# Patient Record
Sex: Female | Born: 1945
Health system: Southern US, Community
[De-identification: ages and names within clinical notes are randomized; demographics above are authoritative.]

## PROBLEM LIST (undated history)

## (undated) DIAGNOSIS — E039 Hypothyroidism, unspecified: Secondary | ICD-10-CM

## (undated) DIAGNOSIS — Z85828 Personal history of other malignant neoplasm of skin: Secondary | ICD-10-CM

## (undated) DIAGNOSIS — H269 Unspecified cataract: Secondary | ICD-10-CM

## (undated) DIAGNOSIS — Z8719 Personal history of other diseases of the digestive system: Secondary | ICD-10-CM

## (undated) DIAGNOSIS — F17200 Nicotine dependence, unspecified, uncomplicated: Secondary | ICD-10-CM

## (undated) DIAGNOSIS — G43909 Migraine, unspecified, not intractable, without status migrainosus: Secondary | ICD-10-CM

## (undated) HISTORY — DX: Personal history of other diseases of the digestive system: Z87.19

## (undated) HISTORY — DX: Migraine, unspecified, not intractable, without status migrainosus: G43.909

## (undated) HISTORY — DX: Personal history of other malignant neoplasm of skin: Z85.828

## (undated) HISTORY — DX: Hypothyroidism, unspecified: E03.9

## (undated) HISTORY — DX: Unspecified cataract: H26.9

## (undated) HISTORY — DX: Nicotine dependence, unspecified, uncomplicated: F17.200

---

## 1999-01-02 ENCOUNTER — Other Ambulatory Visit: Admission: RE | Admit: 1999-01-02 | Discharge: 1999-01-02 | Payer: Self-pay | Admitting: Gynecology

## 1999-01-06 ENCOUNTER — Other Ambulatory Visit: Admission: RE | Admit: 1999-01-06 | Discharge: 1999-01-06 | Payer: Self-pay | Admitting: Gastroenterology

## 1999-02-19 ENCOUNTER — Other Ambulatory Visit: Admission: RE | Admit: 1999-02-19 | Discharge: 1999-02-19 | Payer: Self-pay | Admitting: *Deleted

## 2000-01-29 ENCOUNTER — Other Ambulatory Visit: Admission: RE | Admit: 2000-01-29 | Discharge: 2000-01-29 | Payer: Self-pay | Admitting: Gynecology

## 2000-07-04 ENCOUNTER — Encounter: Payer: Self-pay | Admitting: Emergency Medicine

## 2000-07-04 ENCOUNTER — Emergency Department (HOSPITAL_COMMUNITY): Admission: EM | Admit: 2000-07-04 | Discharge: 2000-07-04 | Payer: Self-pay | Admitting: Emergency Medicine

## 2001-02-07 ENCOUNTER — Other Ambulatory Visit: Admission: RE | Admit: 2001-02-07 | Discharge: 2001-02-07 | Payer: Self-pay | Admitting: Gynecology

## 2002-03-02 ENCOUNTER — Other Ambulatory Visit: Admission: RE | Admit: 2002-03-02 | Discharge: 2002-03-02 | Payer: Self-pay | Admitting: Gynecology

## 2002-11-02 HISTORY — PX: THYROIDECTOMY: SHX17

## 2003-04-09 ENCOUNTER — Other Ambulatory Visit: Admission: RE | Admit: 2003-04-09 | Discharge: 2003-04-09 | Payer: Self-pay | Admitting: Gynecology

## 2004-10-30 ENCOUNTER — Encounter: Admission: RE | Admit: 2004-10-30 | Discharge: 2004-10-30 | Payer: Self-pay | Admitting: General Surgery

## 2004-11-02 LAB — CONVERTED CEMR LAB: Pap Smear: NORMAL

## 2004-11-20 ENCOUNTER — Other Ambulatory Visit: Admission: RE | Admit: 2004-11-20 | Discharge: 2004-11-20 | Payer: Self-pay | Admitting: Gynecology

## 2004-11-28 ENCOUNTER — Ambulatory Visit (HOSPITAL_COMMUNITY): Admission: RE | Admit: 2004-11-28 | Discharge: 2004-11-28 | Payer: Self-pay | Admitting: General Surgery

## 2004-11-28 ENCOUNTER — Encounter (INDEPENDENT_AMBULATORY_CARE_PROVIDER_SITE_OTHER): Payer: Self-pay | Admitting: *Deleted

## 2006-11-12 ENCOUNTER — Encounter (INDEPENDENT_AMBULATORY_CARE_PROVIDER_SITE_OTHER): Payer: Self-pay | Admitting: Specialist

## 2006-11-12 ENCOUNTER — Ambulatory Visit (HOSPITAL_COMMUNITY): Admission: RE | Admit: 2006-11-12 | Discharge: 2006-11-13 | Payer: Self-pay | Admitting: Surgery

## 2010-10-16 ENCOUNTER — Ambulatory Visit: Payer: Self-pay | Admitting: Family Medicine

## 2010-11-13 ENCOUNTER — Ambulatory Visit
Admission: RE | Admit: 2010-11-13 | Discharge: 2010-11-13 | Payer: Self-pay | Source: Home / Self Care | Attending: Family Medicine | Admitting: Family Medicine

## 2010-11-13 ENCOUNTER — Other Ambulatory Visit: Payer: Self-pay | Admitting: Family Medicine

## 2010-11-13 DIAGNOSIS — F17210 Nicotine dependence, cigarettes, uncomplicated: Secondary | ICD-10-CM | POA: Insufficient documentation

## 2010-11-13 DIAGNOSIS — G43909 Migraine, unspecified, not intractable, without status migrainosus: Secondary | ICD-10-CM

## 2010-11-13 DIAGNOSIS — F17200 Nicotine dependence, unspecified, uncomplicated: Secondary | ICD-10-CM

## 2010-11-13 DIAGNOSIS — Z85828 Personal history of other malignant neoplasm of skin: Secondary | ICD-10-CM | POA: Insufficient documentation

## 2010-11-13 DIAGNOSIS — Z8719 Personal history of other diseases of the digestive system: Secondary | ICD-10-CM

## 2010-11-13 DIAGNOSIS — E039 Hypothyroidism, unspecified: Secondary | ICD-10-CM

## 2010-11-13 HISTORY — DX: Nicotine dependence, unspecified, uncomplicated: F17.200

## 2010-11-13 HISTORY — DX: Personal history of other malignant neoplasm of skin: Z85.828

## 2010-11-13 HISTORY — DX: Personal history of other diseases of the digestive system: Z87.19

## 2010-11-13 HISTORY — DX: Migraine, unspecified, not intractable, without status migrainosus: G43.909

## 2010-11-13 HISTORY — DX: Hypothyroidism, unspecified: E03.9

## 2010-11-14 LAB — TSH: TSH: 0.95 u[IU]/mL (ref 0.35–5.50)

## 2010-11-20 ENCOUNTER — Other Ambulatory Visit: Payer: Self-pay | Admitting: Dermatology

## 2010-12-04 NOTE — Assessment & Plan Note (Signed)
Summary: BRAND NEW PT/TO EST/CJR/PATIENT RESCHED.   Vital Signs:  Patient profile:   65 year old female Menstrual status:  postmenopausal Height:      65.75 inches Weight:      157 pounds BMI:     25.63 Temp:     98.2 degrees F oral Pulse rate:   72 / minute Pulse rhythm:   regular Resp:     12 per minute BP sitting:   120 / 78  (left arm) Cuff size:   regular  Vitals Entered By: Sid Falcon LPN (November 13, 2010 10:15 AM)  Nutrition Counseling: Patient's BMI is greater than 25 and therefore counseled on weight management options.     Menstrual Status postmenopausal Last PAP Result normal   History of Present Illness: New patient to establish care. Patient has history of hypothyroidism following thyroidectomy 2004 for benign tumor. History of squamous cell cancer  lower legs followed closely by dermatology. Remote history of migraines. Reported history of hepatitis, questionably hepatitis A many years ago. History diverticulitis. Intolerance to codeine.  Family history mother kidney cancer metastatic to lung. Father died of coronary artery disease age 49. Follow up hypertension history.  Patient is married. One child. Retired Designer, jewellery. Smokes and no interest in quitting. No alcohol use. Enjoys golf.  Preventive Screening-Counseling & Management  Alcohol-Tobacco     Smoking Status: current     Packs/Day: 0.75     Year Started: 1980  Caffeine-Diet-Exercise     Does Patient Exercise: yes  Allergies (verified): 1)  Codeine Sulfate (Codeine Sulfate)  Past History:  Family History: Last updated: 11/13/2010 Breast CV, both grandmothers, 2 aunts Mother, kidney CA,  Father, heart disease age 22, hypertension  Social History: Last updated: 11/13/2010 Retired  Engineer, civil (consulting), RN Married Current Smoker Alcohol use-no Regular exercise-yes 1 pregnancy 1 live birth  Risk Factors: Exercise: yes (11/13/2010)  Risk Factors: Smoking Status: current  (11/13/2010) Packs/Day: 0.75 (11/13/2010)  Past Medical History: Diverticulitis, hx of Hepatitis/Jaundice ?hep A Migraines Hypothyroid Squamous cell ca legs  Past Surgical History: Thyroidectomy  2004 PMH-FH-SH reviewed for relevance  Family History: Breast CV, both grandmothers, 2 aunts Mother, kidney CA,  Father, heart disease age 21, hypertension  Social History: Retired  Engineer, civil (consulting), Charity fundraiser Married Current Smoker Alcohol use-no Regular exercise-yes 1 pregnancy 1 live birth Smoking Status:  current Packs/Day:  0.75 Does Patient Exercise:  yes  Review of Systems  The patient denies anorexia, fever, weight loss, weight gain, vision loss, decreased hearing, hoarseness, chest pain, syncope, dyspnea on exertion, peripheral edema, prolonged cough, headaches, hemoptysis, abdominal pain, melena, hematochezia, severe indigestion/heartburn, hematuria, incontinence, genital sores, muscle weakness, suspicious skin lesions, transient blindness, difficulty walking, depression, unusual weight change, abnormal bleeding, enlarged lymph nodes, and breast masses.    Physical Exam  General:  Well-developed,well-nourished,in no acute distress; alert,appropriate and cooperative throughout examination Eyes:  pupils equal, pupils round, and pupils reactive to light.   Ears:  External ear exam shows no significant lesions or deformities.  Otoscopic examination reveals clear canals, tympanic membranes are intact bilaterally without bulging, retraction, inflammation or discharge. Hearing is grossly normal bilaterally. Mouth:  Oral mucosa and oropharynx without lesions or exudates.  Teeth in good repair. Neck:  scar prior surgery, no mass Lungs:  Normal respiratory effort, chest expands symmetrically. Lungs are clear to auscultation, no crackles or wheezes. Heart:  Normal rate and regular rhythm. S1 and S2 normal without gallop, murmur, click, rub or other extra sounds. Extremities:  no edema Cervical  Nodes:  No lymphadenopathy noted Psych:  normally interactive, good eye contact, not anxious appearing, and not depressed appearing.     Impression & Recommendations:  Problem # 1:  HYPOTHYROIDISM (ICD-244.9) repeat TSH Her updated medication list for this problem includes:    Synthroid 112 Mcg Tabs (Levothyroxine sodium) ..... Once daily  Orders: TLB-TSH (Thyroid Stimulating Hormone) (84443-TSH) Venipuncture (81191) Specimen Handling (47829)  Problem # 2:  DIVERTICULITIS, HX OF (ICD-V12.79)  Problem # 3:  MIGRAINE HEADACHE (ICD-346.90)  Problem # 4:  SKIN CANCER, HX OF (ICD-V10.83)  Problem # 5:  TOBACCO ABUSE (ICD-305.1) pt not currently motivated to quit.  Complete Medication List: 1)  Synthroid 112 Mcg Tabs (Levothyroxine sodium) .... Once daily  Other Orders: Pneumococcal Vaccine (56213) Admin 1st Vaccine (08657)  Patient Instructions: 1)  consider physical examination at some point this year   Orders Added: 1)  TLB-TSH (Thyroid Stimulating Hormone) [84443-TSH] 2)  Venipuncture [84696] 3)  Specimen Handling [99000] 4)  Pneumococcal Vaccine [90732] 5)  Admin 1st Vaccine [90471] 6)  New Patient Level III [29528]   Immunizations Administered:  Pneumonia Vaccine:    Vaccine Type: Pneumovax    Site: right deltoid    Mfr: Merck    Dose: 0.5 ml    Route: IM    Given by: Sid Falcon LPN    Exp. Date: 03/02/2012    Lot #: [id number]   Immunizations Administered:  Pneumonia Vaccine:    Vaccine Type: Pneumovax    Site: right deltoid    Mfr: Merck    Dose: 0.5 ml    Route: IM    Given by: Sid Falcon LPN    Exp. Date: 03/02/2012    Lot #: [id number]  Preventive Care Screening  Mammogram:    Date:  11/02/2009    Results:  normal   Colonoscopy:    Date:  11/02/2008    Results:  normal   Pap Smear:    Date:  11/02/2004    Results:  normal

## 2011-01-26 ENCOUNTER — Other Ambulatory Visit: Payer: Self-pay | Admitting: Dermatology

## 2011-03-05 ENCOUNTER — Encounter: Payer: Self-pay | Admitting: Family Medicine

## 2011-03-09 ENCOUNTER — Encounter: Payer: Self-pay | Admitting: Family Medicine

## 2011-03-20 NOTE — Op Note (Signed)
[name], [name]                 ACCOUNT NO.:  [id number]   MEDICAL RECORD NO.:  [id number]          PATIENT TYPE:  AMB   LOCATION:  DAY                          FACILITY:  [facility]   PHYSICIAN:  Velora Heckler, MD      DATE OF BIRTH:  07/31/1946   DATE OF PROCEDURE:  11/12/2006  DATE OF DISCHARGE:                               OPERATIVE REPORT   PREOP DIAGNOSIS:  Thyroid goiter with compressive symptoms.   POSTOPERATIVE DIAGNOSIS:  Thyroid goiter with compressive symptoms.   PROCEDURE:  Total thyroidectomy.   SURGEON:  Velora Heckler, MD, FACS   ASSISTANT:  Leonie Man, MD   ANESTHESIA:  General.   ESTIMATED BLOOD LOSS:  Minimal.   PREPARATION:  Betadine.   COMPLICATIONS:  None.   INDICATIONS:  The patient is a 65 year old white female from [place], [place].  She has been followed by Dr. Jerelene Redden.  She has a known thyroid goiter.  On sequential ultrasound examination,  her goiter has increased in size.  Fine-needle aspiration has been  performed.  The patient has developed mild compressive symptoms.  She  now comes to surgery for resection.   BODY OF REPORT:  Procedure is done in OR #6 at the [facility].  The patient is brought to the operating room, and placed in  the supine position on the operating room table.  Following  administration of general anesthesia, the patient is prepped and draped  in the usual strict aseptic fashion.  After ascertaining that an  adequate level of anesthesia had been obtained, a Kocher incision is  made with a #15 blade.  Dissection is carried through subcutaneous  tissues and platysma.  Hemostasis is obtained with the electrocautery.  Skin flaps are elevated cephalad and caudad from the thyroid notch to  the sternal notch.  A Mahorner self-retaining retractor is placed for  exposure.   Strap muscles are incised in the midline and dissection is begun on the  left side.  Left thyroid lobe is  exposed by reflecting the strap muscles  laterally.  Venous tributaries are divided with the harmonic scalpel.  Gland is gently dissected out using a Barista.  Inferior venous  tributaries are divided between medium Ligaclips and use of harmonic  scalpel.  Superior pole vessels are dissected out.  They are divided  with the harmonic scalpel.  Gland is rolled anteriorly.  Superior  parathyroid gland is identified and preserved.  Recurrent laryngeal  nerve is identified and preserved.  Branches of the inferior thyroid  artery are divided with the harmonic scalpel.  Gland is rolled further  anteriorly.  Using the electrocautery, the ligament of Allyson Sabal is  transected; and dissection is carried down and onto the trachea.  Isthmus is mobilized across the midline.  Good hemostasis is noted.  Dry  pack is placed in the left neck.   Next, we turned our attention to the right thyroid lobe.  The right  thyroid lobe is markedly larger than the left.  Both appear to be  goitrous.  There  are no dominant masses.  Strap muscles are again  reflected laterally.  Middle thyroid vein is divided between medium  Ligaclips.  Inferior venous tributaries are divided with the harmonic  scalpel.  The superior pole vessels are dissected out.  The superior  thyroid artery is occluded with a large Ligaclip and then divided with  the harmonic scalpel.  The gland is rolled anteriorly.  Parathyroid  tissue is identified and preserved.  The branches of the inferior  thyroid artery are divided between small Ligaclips.  Inferior venous  tributaries are divided with the harmonic scalpel.  Gland is rolled  further anteriorly; and ligament of Berry transected with the  electrocautery.  Care is taken to preserve the recurrent nerve.  There  is a small pyramidal lobe which is included with the specimen.  The  gland is excised off the trachea; and the inferior central compartment  tissue divided with the harmonic  scalpel.  The specimen is marked with a  suture in the right superior pole.  The entire gland is then submitted  to pathology for review.  Good hemostasis is noted.  Neck is irrigated  with warm saline which is evacuated.  Surgicel is placed over the area  of the recurrent nerve and parathyroid glands bilaterally.  Strap  muscles were reapproximated in the midline with interrupted 3-0 Vicryl  sutures.  Platysma is closed with interrupted 3-0 Vicryl sutures.  Skin  is closed with a running 4-0 Monocryl subcuticular suture.  Wound is  washed and dried; and Benzoin and Steri-Strips are applied.  Sterile  dressings are applied.  The patient is awakened from anesthesia; and  brought to the recovery room in stable condition.  The patient tolerated  the procedure well.      Velora Heckler, MD  Electronically Signed     TMG/MEDQ  D:  11/12/2006  T:  11/12/2006  Job:  [id number]   cc:   Gita Kudo, M.D.  [address], [address]  [place]  [place] [address]

## 2011-03-31 ENCOUNTER — Encounter: Payer: Self-pay | Admitting: Family Medicine

## 2011-04-01 ENCOUNTER — Encounter: Payer: Self-pay | Admitting: Family Medicine

## 2011-04-01 ENCOUNTER — Ambulatory Visit (INDEPENDENT_AMBULATORY_CARE_PROVIDER_SITE_OTHER): Payer: Medicare Other | Admitting: Family Medicine

## 2011-04-01 VITALS — BP 122/78 | HR 72 | Temp 98.0°F | Resp 12 | Ht 65.75 in | Wt 151.0 lb

## 2011-04-01 DIAGNOSIS — Z23 Encounter for immunization: Secondary | ICD-10-CM

## 2011-04-01 DIAGNOSIS — E039 Hypothyroidism, unspecified: Secondary | ICD-10-CM

## 2011-04-01 DIAGNOSIS — Z0000 Encounter for general adult medical examination without abnormal findings: Secondary | ICD-10-CM

## 2011-04-01 LAB — CBC WITH DIFFERENTIAL/PLATELET
Basophils Relative: 0.7 % (ref 0.0–3.0)
Eosinophils Absolute: 0.2 10*3/uL (ref 0.0–0.7)
Hemoglobin: 14.4 g/dL (ref 12.0–15.0)
Lymphocytes Relative: 32.2 % (ref 12.0–46.0)
MCHC: 34.2 g/dL (ref 30.0–36.0)
MCV: 92 fl (ref 78.0–100.0)
Neutro Abs: 3.7 10*3/uL (ref 1.4–7.7)
RBC: 4.6 Mil/uL (ref 3.87–5.11)

## 2011-04-01 LAB — HEPATIC FUNCTION PANEL
ALT: 21 U/L (ref 0–35)
Albumin: 4 g/dL (ref 3.5–5.2)
Alkaline Phosphatase: 70 U/L (ref 39–117)
Total Protein: 6.8 g/dL (ref 6.0–8.3)

## 2011-04-01 LAB — BASIC METABOLIC PANEL
CO2: 28 mEq/L (ref 19–32)
Calcium: 9.5 mg/dL (ref 8.4–10.5)
Chloride: 107 mEq/L (ref 96–112)
Sodium: 142 mEq/L (ref 135–145)

## 2011-04-01 LAB — LIPID PANEL: Total CHOL/HDL Ratio: 3

## 2011-04-01 NOTE — Patient Instructions (Signed)
Continue to work on smoking cessation. Continue regular use of sunblock Continue calcium and vitamin D supplementation Continue regular weightbearing exercise. Continue yearly mammogram. Consider baseline bone density scan by next year

## 2011-04-01 NOTE — Progress Notes (Signed)
Subjective:    Patient ID: [name], female    DOB: Jul 09, 1946, 65 y.o.   MRN: [id number]  HPI Patient is here for Medicare initial wellness exam. Past medical history significant for hypothyroidism, migraine headaches, squamous cell skin cancer, and ongoing nicotine use. She sees dermatologist regularly.  1.  Risk factors based on Past Medical , Social, and Family history  history of hypothyroidism and migraine headaches. Ongoing nicotine use. No alcohol use. Family history breast cancer and a couple aunts 2.  Limitations in physical activities patient plays golf frequently. For a couple he walks. No limitation in activities 3.  Depression/mood no depression or anxiety issues 4.  Hearing no hearing deficits 5.  ADLs fully independent in all 6.  Cognitive function (orientation to time and place, language, writing, speech,memory) patient has no deficits and short or long-term memory. No language deficits.  No judgment deficits 7.  Home Safety no issues identified 8.  Height, weight, and visual acuity. Height and weight stable. No visual changes. 9.  Counseling counseled regarding smoking cessation. Counseled regarding immunizations including tetanus booster and consideration for shingles vaccine. 10. Recommendation of preventive services. Tetanus booster. Check on coverage for shingles vaccine. Colonoscopy and mammogram up-to-date. Patient refuses further Pap smears. Discussed consideration for DEXA scan. Baseline EKG. Discussed vitamin D and calcium supplementation 11. Labs based on risk factors lipid panel and basic metabolic panel 12. Care Plan  As above.  Rec smoking cessation.  Continue with preventive screenings as above.  She refuses pap and DEXA at this time.    Review of Systems  Constitutional: Negative for fever, chills, activity change, appetite change and fatigue.  HENT: Negative for hearing loss, ear pain, sore throat, trouble swallowing and sinus pressure.   Eyes: Negative  for visual disturbance.  Respiratory: Negative for cough and shortness of breath.   Cardiovascular: Negative for chest pain and palpitations.  Gastrointestinal: Negative for abdominal pain, diarrhea, constipation and blood in stool.  Genitourinary: Negative for dysuria and hematuria.  Musculoskeletal: Negative for myalgias, back pain and arthralgias.  Skin: Negative for rash.  Neurological: Negative for dizziness, seizures, syncope and headaches.  Hematological: Negative for adenopathy.  Psychiatric/Behavioral: Negative for confusion and dysphoric mood.       Objective:   Physical Exam  Constitutional: She is oriented to person, place, and time. She appears well-developed and well-nourished.  HENT:  Head: Normocephalic and atraumatic.  Eyes: EOM are normal. Pupils are equal, round, and reactive to light.  Neck: Normal range of motion. Neck supple. No thyromegaly present.  Cardiovascular: Normal rate, regular rhythm and normal heart sounds.   No murmur heard. Pulmonary/Chest: Breath sounds normal. No respiratory distress. She has no wheezes. She has no rales.  Abdominal: Soft. Bowel sounds are normal. She exhibits no distension and no mass. There is no tenderness. There is no rebound and no guarding.  Musculoskeletal: Normal range of motion. She exhibits no edema.  Lymphadenopathy:    She has no cervical adenopathy.  Neurological: She is alert and oriented to person, place, and time. She displays normal reflexes. No cranial nerve deficit.  Skin: No rash noted.  Psychiatric: She has a normal mood and affect. Her behavior is normal. Judgment and thought content normal.          Assessment & Plan:  Patient here for Medicare wellness exam. Tetanus booster given. Zostavax discussed and recommended if covered by insurance. Obtain lipid panel and basic metabolic panel. Baseline EKG. Discussed smoking cessation. Patient declines further  Pap smears. Colonoscopy update. Recommended  consideration DEXA scan and she wishes to wait EKG sinus bradycardia otherwise normal. Smoking cessation discussed and pt does not feel ready at this time.

## 2011-04-02 NOTE — Progress Notes (Signed)
Quick Note:  Pt informed ______ 

## 2011-04-21 ENCOUNTER — Other Ambulatory Visit: Payer: Self-pay | Admitting: Dermatology

## 2011-05-27 ENCOUNTER — Other Ambulatory Visit: Payer: Self-pay | Admitting: Dermatology

## 2011-09-08 ENCOUNTER — Other Ambulatory Visit: Payer: Self-pay | Admitting: Dermatology

## 2011-09-09 ENCOUNTER — Other Ambulatory Visit: Payer: Self-pay | Admitting: Dermatology

## 2011-11-09 ENCOUNTER — Ambulatory Visit (INDEPENDENT_AMBULATORY_CARE_PROVIDER_SITE_OTHER): Payer: Medicare Other | Admitting: Family Medicine

## 2011-11-09 ENCOUNTER — Encounter: Payer: Self-pay | Admitting: Family Medicine

## 2011-11-09 VITALS — BP 140/82 | Temp 98.6°F | Wt 156.0 lb

## 2011-11-09 DIAGNOSIS — R05 Cough: Secondary | ICD-10-CM

## 2011-11-09 DIAGNOSIS — E039 Hypothyroidism, unspecified: Secondary | ICD-10-CM

## 2011-11-09 DIAGNOSIS — R1032 Left lower quadrant pain: Secondary | ICD-10-CM

## 2011-11-09 MED ORDER — LEVOTHYROXINE SODIUM 112 MCG PO TABS: 112.0000 ug | ORAL_TABLET | Freq: Every day | ORAL | Status: DC

## 2011-11-09 MED ORDER — CIPROFLOXACIN HCL 500 MG PO TABS: 500.0000 mg | ORAL_TABLET | Freq: Two times a day (BID) | ORAL | Status: AC

## 2011-11-09 MED ORDER — METRONIDAZOLE 500 MG PO TABS: 500.0000 mg | ORAL_TABLET | Freq: Two times a day (BID) | ORAL | Status: AC

## 2011-11-09 NOTE — Patient Instructions (Signed)
Diverticulitis A diverticulum is a small pouch or sac on the colon. Diverticulosis is the presence of these diverticula on the colon. Diverticulitis is the irritation (inflammation) or infection of diverticula. CAUSES  The colon and its diverticula contain bacteria. If food particles block the tiny opening to a diverticulum, the bacteria inside can grow and cause an increase in pressure. This leads to infection and inflammation and is called diverticulitis. SYMPTOMS   Abdominal pain and tenderness. Usually, the pain is located on the left side of your abdomen. However, it could be located elsewhere.   Fever.   Bloating.   Feeling sick to your stomach (nausea).   Throwing up (vomiting).   Abnormal stools.  DIAGNOSIS  Your caregiver will take a history and perform a physical exam. Since many things can cause abdominal pain, other tests may be necessary. Tests may include:  Blood tests.   Urine tests.   X-ray of the abdomen.   CT scan of the abdomen.  Sometimes, surgery is needed to determine if diverticulitis or other conditions are causing your symptoms. TREATMENT  Most of the time, you can be treated without surgery. Treatment includes:  Resting the bowels by only having liquids for a few days. As you improve, you will need to eat a low-fiber diet.   Intravenous (IV) fluids if you are losing body fluids (dehydrated).   Antibiotic medicines that treat infections may be given.   Pain and nausea medicine, if needed.   Surgery if the inflamed diverticulum has burst.  HOME CARE INSTRUCTIONS   Try a clear liquid diet (broth, tea, or water for as long as directed by your caregiver). You may then gradually begin a low-fiber diet as tolerated. A low-fiber diet is a diet with less than 10 grams of fiber. Choose the foods below to reduce fiber in the diet:   White breads, cereals, rice, and pasta.   Cooked fruits and vegetables or soft fresh fruits and vegetables without the skin.     Ground or well-cooked tender beef, ham, veal, lamb, pork, or poultry.   Eggs and seafood.   After your diverticulitis symptoms have improved, your caregiver may put you on a high-fiber diet. A high-fiber diet includes 14 grams of fiber for every 1000 calories consumed. For a standard 2000 calorie diet, you would need 28 grams of fiber. Follow these diet guidelines to help you increase the fiber in your diet. It is important to slowly increase the amount fiber in your diet to avoid gas, constipation, and bloating.   Choose whole-grain breads, cereals, pasta, and brown rice.   Choose fresh fruits and vegetables with the skin on. Do not overcook vegetables because the more vegetables are cooked, the more fiber is lost.   Choose more nuts, seeds, legumes, dried peas, beans, and lentils.   Look for food products that have greater than 3 grams of fiber per serving on the Nutrition Facts label.   Take all medicine as directed by your caregiver.   If your caregiver has given you a follow-up appointment, it is very important that you go. Not going could result in lasting (chronic) or permanent injury, pain, and disability. If there is any problem keeping the appointment, call to reschedule.  SEEK MEDICAL CARE IF:   Your pain does not improve.   You have a hard time advancing your diet beyond clear liquids.   Your bowel movements do not return to normal.  SEEK IMMEDIATE MEDICAL CARE IF:   Your pain becomes   worse.   You have an oral temperature above 102 F (38.9 C), not controlled by medicine.   You have repeated vomiting.   You have bloody or black, tarry stools.   Symptoms that brought you to your caregiver become worse or are not getting better.  MAKE SURE YOU:   Understand these instructions.   Will watch your condition.   Will get help right away if you are not doing well or get worse.  Document Released: 07/29/2005 Document Revised: 07/01/2011 Document Reviewed:  11/24/2010 Exit[employer] Patient Information 2012 [employer]. 

## 2011-11-09 NOTE — Progress Notes (Signed)
  Subjective:    Patient ID: [name], female    DOB: Jun 21, 1946, 66 y.o.   MRN: [id number]  HPI  Patient seen with the following issues  Upper respiratory symptoms which started last week. Said that the congestion and cough which is mostly nonproductive. No fever or chills. Cough is relatively mild.  Hypothyroidism treated with levothyroxine 112 mcg. Needs refills. TSH normal last May. Apply with therapy.  New problem of left lower quadrant abdominal pain. Onset 2 days ago. Pain is continuous. Mild to moderate severity. The patient's left lower quadrant without radiation. No stool changes. No dysuria. No fever or chills. Reported history of diverticulitis in the past. No nausea or vomiting. No aggravating or alleviating factors.   Review of Systems  Constitutional: Positive for fatigue. Negative for fever, chills and unexpected weight change.  HENT: Positive for congestion.   Respiratory: Positive for cough. Negative for shortness of breath and wheezing.   Gastrointestinal: Positive for abdominal pain. Negative for nausea, vomiting, diarrhea and constipation.  Genitourinary: Negative for dysuria.       Objective:   Physical Exam  Constitutional: She appears well-developed and well-nourished.  HENT:  Right Ear: External ear normal.  Left Ear: External ear normal.  Mouth/Throat: Oropharynx is clear and moist.  Neck: Neck supple.  Cardiovascular: Normal rate and regular rhythm.   Pulmonary/Chest: Effort normal and breath sounds normal. No respiratory distress. She has no wheezes. She has no rales.  Abdominal: Soft. She exhibits no distension and no mass. There is tenderness. There is no rebound and no guarding.       Tender left lower quadrant. No guarding or rebound.  Lymphadenopathy:    She has no cervical adenopathy.          Assessment & Plan:  #1 abdominal pain left lower quadrant. Suspect acute diverticulitis. Nonacute abdomen. Cipro 500 mg twice a day for 10 days  and Flagyl 500 mg twice a day for 10 days. Followup if symptoms not promptly improving in the next couple days #2 hypothyroidism refill levothyroxin #3 cough probably secondary to acute viral upper rest for infection. Nonfocal exam. Reassurance

## 2011-12-08 ENCOUNTER — Other Ambulatory Visit: Payer: Self-pay | Admitting: Dermatology

## 2011-12-14 ENCOUNTER — Telehealth: Payer: Self-pay | Admitting: *Deleted

## 2011-12-14 MED ORDER — LEVOTHYROXINE SODIUM 112 MCG PO TABS: 112.0000 ug | ORAL_TABLET | Freq: Every day | ORAL | Status: DC

## 2011-12-14 NOTE — Telephone Encounter (Signed)
Pt requesting 90 day refill of synthroid 112 mg sent to [facility].  States pharmacy does not have refill on file for her.

## 2011-12-30 ENCOUNTER — Other Ambulatory Visit: Payer: Self-pay | Admitting: Dermatology

## 2012-01-19 ENCOUNTER — Other Ambulatory Visit: Payer: Self-pay | Admitting: Dermatology

## 2012-04-25 ENCOUNTER — Other Ambulatory Visit: Payer: Self-pay | Admitting: Dermatology

## 2012-06-01 ENCOUNTER — Other Ambulatory Visit: Payer: Self-pay | Admitting: Dermatology

## 2012-08-03 ENCOUNTER — Encounter: Payer: Self-pay | Admitting: Family Medicine

## 2012-08-04 LAB — HM MAMMOGRAPHY

## 2012-08-08 ENCOUNTER — Encounter: Payer: Self-pay | Admitting: Family Medicine

## 2012-09-13 ENCOUNTER — Other Ambulatory Visit: Payer: Self-pay | Admitting: Dermatology

## 2012-10-03 ENCOUNTER — Other Ambulatory Visit: Payer: Self-pay | Admitting: Dermatology

## 2012-10-24 ENCOUNTER — Other Ambulatory Visit: Payer: Self-pay | Admitting: Dermatology

## 2012-11-15 ENCOUNTER — Other Ambulatory Visit: Payer: Self-pay | Admitting: Family Medicine

## 2013-02-01 ENCOUNTER — Telehealth: Payer: Self-pay | Admitting: Family Medicine

## 2013-02-01 NOTE — Telephone Encounter (Signed)
Patient calls to see if she needs to be fasting for lab work at 02/03/13's appointment.  RN checked last visit notes, and looked for any prescheduled labwork.  Patient states the blood work is for Thyroid functions.  RN informed her that she did not need to be fasting unless appointment was scheduled early. Patient notes it is at 015:00.  RN said she would be fine non-fasting.  Caller demonstrated understanding.

## 2013-02-02 ENCOUNTER — Ambulatory Visit: Payer: Medicare Other | Admitting: Family Medicine

## 2013-02-03 ENCOUNTER — Ambulatory Visit: Payer: Medicare Other | Admitting: Family Medicine

## 2013-02-03 ENCOUNTER — Ambulatory Visit (INDEPENDENT_AMBULATORY_CARE_PROVIDER_SITE_OTHER): Payer: Medicare Other | Admitting: Family Medicine

## 2013-02-03 ENCOUNTER — Encounter: Payer: Self-pay | Admitting: Family Medicine

## 2013-02-03 VITALS — BP 120/80 | Temp 98.6°F

## 2013-02-03 DIAGNOSIS — E039 Hypothyroidism, unspecified: Secondary | ICD-10-CM

## 2013-02-03 MED ORDER — SYNTHROID 112 MCG PO TABS: 112.0000 ug | ORAL_TABLET | Freq: Every day | ORAL | Status: DC

## 2013-02-03 NOTE — Patient Instructions (Addendum)

## 2013-02-03 NOTE — Progress Notes (Signed)
  Subjective:    Patient ID: [name], female    DOB: 25-Mar-1946, 67 y.o.   MRN: [id number]  HPI  Followup hypothyroidism Patient takes Synthroid 112 mcg daily. Mostly compliant with therapy. No labs in over one year. She has history of squamous cell skin cancer followed closely by dermatology. Long history of nicotine use. Patient has refused complete physicals or any other assessment.  Past Medical History  Diagnosis Date  . HYPOTHYROIDISM 11/13/2010  . TOBACCO ABUSE 11/13/2010  . MIGRAINE HEADACHE 11/13/2010  . SKIN CANCER, HX OF 11/13/2010  . DIVERTICULITIS, HX OF 11/13/2010   Past Surgical History  Procedure Laterality Date  . Thyroidectomy  2004    reports that she has been smoking Cigarettes.  She has a 28 pack-year smoking history. She does not have any smokeless tobacco history on file. Her alcohol and drug histories are not on file. family history includes Cancer in her maternal aunt, maternal grandmother, paternal aunt, and paternal grandmother. Allergies  Allergen Reactions  . Codeine Sulfate     REACTION: gi upset     Review of Systems  Constitutional: Negative for fatigue.  Eyes: Negative for visual disturbance.  Respiratory: Negative for cough, chest tightness, shortness of breath and wheezing.   Cardiovascular: Negative for chest pain, palpitations and leg swelling.  Neurological: Negative for dizziness, seizures, syncope, weakness, light-headedness and headaches.       Objective:   Physical Exam  Constitutional: She appears well-developed and well-nourished.  Neck: Neck supple. No thyromegaly present.  Cardiovascular: Normal rate and regular rhythm.   Pulmonary/Chest: Effort normal and breath sounds normal. No respiratory distress. She has no wheezes. She has no rales.  Musculoskeletal: She exhibits no edema.          Assessment & Plan:  Hypothyroidism. Recheck TSH. Medication refilled for one year.

## 2013-02-07 ENCOUNTER — Other Ambulatory Visit: Payer: Self-pay | Admitting: *Deleted

## 2013-02-07 DIAGNOSIS — E0590 Thyrotoxicosis, unspecified without thyrotoxic crisis or storm: Secondary | ICD-10-CM

## 2013-02-07 MED ORDER — LEVOTHYROXINE SODIUM 100 MCG PO TABS: 100.0000 ug | ORAL_TABLET | Freq: Every day | ORAL | Status: DC

## 2013-02-07 NOTE — Progress Notes (Signed)
Quick Note:  Pt informed on VM ______ 

## 2013-02-08 ENCOUNTER — Encounter: Payer: Self-pay | Admitting: Family Medicine

## 2013-02-27 ENCOUNTER — Telehealth: Payer: Self-pay | Admitting: Family Medicine

## 2013-02-27 NOTE — Telephone Encounter (Signed)
Patient Information:  Caller Name: Lindamarie  Phone: [phone]  Patient: [name], [name]  Gender: Female  DOB: July 08, 1946  Age: 67 Years  PCP: Evelena Peat (Family Practice)  Office Follow Up:  Does the office need to follow up with this patient?: No  Instructions For The Office: N/A  RN Note:  pt declines appt said MD said she would not need a doctor visit, only needs a lab appt.  Triage transferred call to [facility] in office to have appt scheduled for lab draw.  Symptoms  Reason For Call & Symptoms: Pt calling today 02/27/13 regarding she was in about 3 weeks ago and had blood work drawn regarding her Synthroid.  When her blood work came back MD decreased the dosage.  (She was thinking that she may have taken 2 pills the day before her appt).  MD told her he was decreasing her dosage and if she started having any symptoms to call back and they can do another lab and see if med needs adjusted.  Said she would not need to see the MD just have labs drawn. She is having a lot of fatigue since the dosage was changed and thinks she needs to have her labs redrawn.  Reviewed Health History In EMR: Yes  Reviewed Medications In EMR: Yes  Reviewed Allergies In EMR: Yes  Reviewed Surgeries / Procedures: Yes  Date of Onset of Symptoms: 02/10/2013  Guideline(s) Used:  Weakness (Generalized) and Fatigue  Disposition Per Guideline:   See Within 3 Days in Office  Reason For Disposition Reached:   Mild weakness (i.e., does not interfere with ability to work, go to school, normal activities) and persists > 1 week  Advice Given:  Call Back If:  Unable to stand or walk  Passes out  Breathing difficulty occurs  You become worse.  Patient Refused Recommendation:  Patient Refused Care Advice  Pt only wants lab appt not doctor visit

## 2013-02-28 ENCOUNTER — Other Ambulatory Visit (INDEPENDENT_AMBULATORY_CARE_PROVIDER_SITE_OTHER): Payer: Medicare Other

## 2013-02-28 DIAGNOSIS — E0590 Thyrotoxicosis, unspecified without thyrotoxic crisis or storm: Secondary | ICD-10-CM

## 2013-03-01 NOTE — Progress Notes (Signed)
Quick Note:  Pt informed ______ 

## 2013-04-19 ENCOUNTER — Telehealth: Payer: Self-pay | Admitting: Family Medicine

## 2013-04-19 DIAGNOSIS — E039 Hypothyroidism, unspecified: Secondary | ICD-10-CM

## 2013-04-19 NOTE — Telephone Encounter (Signed)
[facility] Triage Call Report Triage Record Num: [id number] Operator: Lesli Albee Patient Name: [name] Call Date & Time: 04/18/2013 5:10:13PM Patient Phone: [phone] PCP: Patient Gender: Female PCP Fax : Patient DOB: 1946-01-06 [facility] Name: [facility] Reason for Call: Caller: [name]/Patient; PCP: [name] (Family Practice); CB#: [phone]; Call regarding Med change; gaining weight; Pt had her synthroid changed to a generic 6 weeks ago. Pt has gained 9lbs since then. Pt is concerned that the medicine being generic and a reduced dose is making her gain weight. She has not changed her diet or had any other changes. RN advised pt call in the am to get a note to MD. she agreed. Protocol(s) Used: Medication Questions - Adult Recommended Outcome per Protocol: Speak with Provider or Pharmacist within 24 hours Reason for Outcome: Older adult with questions about prescribed and/or nonprescribed medications not covered by available resources Care Advice: Safe Use of Medications: - Keep a list of your medications, listing both brand and generic names, the prescribed dosage, common side effects, recommended action for side effects, when to call their provider, and any other specific instructions. This medication list should be updated if any prescriptions change or if new medications are added. Your list should include nonprescription medications, vitamins and supplements. Ask about interaction with other medications, supplements or foods. - Take the medication list to each provider visit, especially if seeing more than one doctor. Make note of any known allergies on this list. - Use your medication exactly as directed. Any concerns about side effects should be discussed with your pharmacist or prescribing provider before changing doses or stopping the medication. - Don't chew or crush tablets or open capsules unless told to do so. Some long-acting medications can be  absorbed too quickly when chewed or crushed. Other medications either won't be effective or could cause side effects. - If you have difficulty swallowing pills, ask your provider or the pharmacist if the medication is available in liquid form. - For liquid medications, only use measuring device that came with it or was provided by the pharmacy. Household teaspoons and tablespoons can be inaccurate. - Never take anyone else's medication. ~ 04/18/2013 5:16:31PM Page 1 of 1 [id number]

## 2013-04-19 NOTE — Telephone Encounter (Signed)
Rukaya/patient Phone [phone] called to ask if generic Synthroid could be reason for 9 lb weight gain.  Started using Levothyroxine in past 2 months. MD reduced dose and switched to generic. Reports no change to diet.  Remains active (gardening, golf) but feels more "tired than before." Taking thyroid in the AM at least 1 hour before eating.  Pharmacist said the generic medication might be the cause of unexplained weight gain. Would like MD opinion before refilling medication due within next week.   If Synthroid (brand) would make a difference, is willing to go back on brand. Per last TSH note from MD in April, reminded she is due for follow thyroid tests in July or August 2014.  Encouraged 30 minutes of walking daily to > metabolism since golf is played with a golf cart.  [facility].  Please call back.

## 2013-04-19 NOTE — Telephone Encounter (Signed)
Please see another phone note about the same.

## 2013-04-21 NOTE — Telephone Encounter (Signed)
Very unlikely to explain her weight gain but to be sure would repeat TSH within one month.

## 2013-04-21 NOTE — Telephone Encounter (Signed)
Pt informe on personally identified VM

## 2013-05-08 ENCOUNTER — Other Ambulatory Visit (INDEPENDENT_AMBULATORY_CARE_PROVIDER_SITE_OTHER): Payer: Medicare Other

## 2013-05-08 DIAGNOSIS — E039 Hypothyroidism, unspecified: Secondary | ICD-10-CM

## 2013-05-08 LAB — TSH: TSH: 1.62 u[IU]/mL (ref 0.35–5.50)

## 2013-05-09 ENCOUNTER — Telehealth: Payer: Self-pay | Admitting: Family Medicine

## 2013-05-09 NOTE — Telephone Encounter (Addendum)
Pt returned your call she thinks its about lab results.

## 2013-05-10 NOTE — Telephone Encounter (Signed)
Pt is aware of her results.  

## 2013-07-10 ENCOUNTER — Other Ambulatory Visit: Payer: Self-pay | Admitting: Dermatology

## 2013-09-06 ENCOUNTER — Encounter: Payer: Self-pay | Admitting: Family Medicine

## 2014-01-12 ENCOUNTER — Other Ambulatory Visit: Payer: Self-pay | Admitting: Family Medicine

## 2014-04-17 ENCOUNTER — Telehealth: Payer: Self-pay | Admitting: Family Medicine

## 2014-04-17 NOTE — Telephone Encounter (Signed)
[id number] [place], [place]. Requesting 90 day re-fill on levothyroxine (SYNTHROID, LEVOTHROID) 100 MCG tablet

## 2014-04-17 NOTE — Telephone Encounter (Signed)
Pt needs a follow  Up visit.

## 2014-04-23 ENCOUNTER — Ambulatory Visit (INDEPENDENT_AMBULATORY_CARE_PROVIDER_SITE_OTHER): Payer: Medicare HMO | Admitting: Family Medicine

## 2014-04-23 ENCOUNTER — Encounter: Payer: Self-pay | Admitting: Family Medicine

## 2014-04-23 VITALS — BP 120/70 | HR 84 | Temp 98.0°F | Wt 166.0 lb

## 2014-04-23 DIAGNOSIS — Z23 Encounter for immunization: Secondary | ICD-10-CM

## 2014-04-23 DIAGNOSIS — E039 Hypothyroidism, unspecified: Secondary | ICD-10-CM

## 2014-04-23 LAB — TSH: TSH: 1.12 u[IU]/mL (ref 0.35–4.50)

## 2014-04-23 MED ORDER — SYNTHROID 100 MCG PO TABS: 100.0000 ug | ORAL_TABLET | Freq: Every day | ORAL | Status: DC

## 2014-04-23 NOTE — Progress Notes (Signed)
Pre visit review using our clinic review tool, if applicable. No additional management support is needed unless otherwise documented below in the visit note. 

## 2014-04-23 NOTE — Addendum Note (Signed)
Addended by: Marcina Millard on: 04/23/2014 02:08 PM   Modules accepted: Orders

## 2014-04-23 NOTE — Progress Notes (Signed)
   Subjective:    Patient ID: [name], female    DOB: 28-Apr-1946, 68 y.o.   MRN: [id number]  HPI Patient here for followup hypothyroidism. She takes Synthroid. Last year he tried generic that she had fatigue and some weight gain. She has felt better since going back on non-generic. She takes 100 mcg once daily. She has gained some weight her past year. She stays active with gardening and yard activities. She's had previous 23 valent pneumonia vaccine but not Prevnar 13. Still smokes about half-pack cigarettes per day. Low motivation to quit.  Past Medical History  Diagnosis Date  . HYPOTHYROIDISM 11/13/2010  . TOBACCO ABUSE 11/13/2010  . MIGRAINE HEADACHE 11/13/2010  . SKIN CANCER, HX OF 11/13/2010  . DIVERTICULITIS, HX OF 11/13/2010   Past Surgical History  Procedure Laterality Date  . Thyroidectomy  2004    reports that she has been smoking Cigarettes.  She has a 28 pack-year smoking history. She does not have any smokeless tobacco history on file. Her alcohol and drug histories are not on file. family history includes Cancer in her maternal aunt, maternal grandmother, paternal aunt, and paternal grandmother. Allergies  Allergen Reactions  . Codeine Sulfate     REACTION: gi upset      Review of Systems  Constitutional: Negative for fatigue.  Respiratory: Negative for shortness of breath.   Cardiovascular: Negative for chest pain.  Endocrine: Negative for cold intolerance, polydipsia and polyuria.       Objective:   Physical Exam  Constitutional: She appears well-developed and well-nourished.  Neck: Neck supple. No thyromegaly present.  Cardiovascular: Normal rate and regular rhythm.   Pulmonary/Chest: Effort normal and breath sounds normal. No respiratory distress. She has no wheezes. She has no rales.          Assessment & Plan:  Hypothyroidism. Check TSH. Refill medication for one year. Prevnar 13 given

## 2014-04-24 ENCOUNTER — Telehealth: Payer: Self-pay | Admitting: Family Medicine

## 2014-04-24 NOTE — Telephone Encounter (Signed)
Relevant patient education mailed to patient.  

## 2014-06-12 ENCOUNTER — Ambulatory Visit: Payer: Medicare Other | Admitting: Internal Medicine

## 2014-07-03 ENCOUNTER — Other Ambulatory Visit: Payer: Self-pay | Admitting: Dermatology

## 2014-08-17 ENCOUNTER — Telehealth: Payer: Self-pay | Admitting: Family Medicine

## 2014-08-17 NOTE — Telephone Encounter (Signed)
Pt needs referral for a diagnostic mammogram. They found a lump last year..  Last exam 08/21/13. [facility] [id number] [phone]

## 2014-08-20 NOTE — Telephone Encounter (Signed)
Is okay to order.  

## 2014-08-20 NOTE — Telephone Encounter (Signed)
The Dr will have to put this order in .  Then I can send to [facility]  For  scheduling This will have to go to the MD thank you

## 2014-08-20 NOTE — Telephone Encounter (Signed)
OK to order 

## 2014-08-21 ENCOUNTER — Other Ambulatory Visit: Payer: Self-pay | Admitting: Family Medicine

## 2014-08-21 DIAGNOSIS — N6310 Unspecified lump in the right breast, unspecified quadrant: Secondary | ICD-10-CM

## 2014-08-21 NOTE — Telephone Encounter (Signed)
Referral is ordered

## 2014-09-03 ENCOUNTER — Other Ambulatory Visit: Payer: Self-pay | Admitting: Dermatology

## 2014-09-06 ENCOUNTER — Encounter: Payer: Self-pay | Admitting: Family Medicine

## 2014-09-20 ENCOUNTER — Telehealth: Payer: Self-pay | Admitting: Family Medicine

## 2014-09-20 NOTE — Telephone Encounter (Signed)
OK 

## 2014-09-20 NOTE — Telephone Encounter (Signed)
Pt called to request a referral to [facility] Dr [place] Qua    Pt said she is having burning in her back between shoulder blades in the spine she said .Marland Kitchen

## 2014-09-21 ENCOUNTER — Other Ambulatory Visit: Payer: Self-pay | Admitting: Family Medicine

## 2014-09-21 DIAGNOSIS — M549 Dorsalgia, unspecified: Secondary | ICD-10-CM

## 2014-09-21 NOTE — Telephone Encounter (Signed)
Referral is ordered

## 2014-10-02 ENCOUNTER — Telehealth: Payer: Self-pay | Admitting: Family Medicine

## 2014-10-02 NOTE — Telephone Encounter (Signed)
Patient Information:  Caller Name: Gayla  Phone: [phone]  Patient: Jan, Walters  Gender: Female  DOB: 1945/12/15  Age: 68 Years  PCP: Carolann L[facility]ler [facility])  Office Follow Up:  Does the office need to follow up with this patient?: Yes  Instructions For The Office: Pt refused appt today. She has "out of town guests" for 1 week. She asked the RN to look for appts next Monday 10/08/14. RN called the back line and spoke with office staff . They will ask MD about his 1:45 appt and call the pt back.   Symptoms  Reason For Call & Symptoms: Pt states she is calling for a referal. She has a burning pain on the left side of her spine by her shoulder back. Onset several months ago - August. Pt did not think Dr. Elease Hashimoto could diagnosis it so she hasn't called him. She is calling for a referal to get a diagnosis. The pain is constant and 8/10. With Motrin it goes to 2-4/10. Pt states the pain is constant. After triage, RN advised an appt at the office.   Reviewed Health History In EMR: Yes  Reviewed Medications In EMR: Yes  Reviewed Allergies In EMR: Yes  Reviewed Surgeries / Procedures: Yes  Date of Onset of Symptoms: 06/14/2014  Treatments Tried: Pt takes 8-10 Advil /day for the pain which helps  Treatments Tried Worked: Yes  Guideline(s) Used:  Back Pain  Disposition Per Guideline:   Go to Office Now  Reason For Disposition Reached:   Severe back pain  Advice Given:  N/A  Patient Will Follow Care Advice:  YES

## 2014-10-02 NOTE — Telephone Encounter (Signed)
Called and spoke with pt and pt states she has had this back pain for 2 months and it is not life threatening.  Pt states she has family in from out of town and could come in on Thursday or Friday.  Advised pt that Dr. Elease Hashimoto will be out Thursday and Friday.  Pt states she will come in on Dec. 7, 2015 at 1:45 pm. Advised pt if her symptoms worsen she should seek medical attention immediately.  Pt verbalized understanding.

## 2014-10-08 ENCOUNTER — Ambulatory Visit (INDEPENDENT_AMBULATORY_CARE_PROVIDER_SITE_OTHER): Payer: Medicare HMO | Admitting: Family Medicine

## 2014-10-08 ENCOUNTER — Encounter: Payer: Self-pay | Admitting: Family Medicine

## 2014-10-08 VITALS — BP 120/70 | HR 87 | Temp 97.8°F | Wt 164.0 lb

## 2014-10-08 DIAGNOSIS — M546 Pain in thoracic spine: Secondary | ICD-10-CM

## 2014-10-08 DIAGNOSIS — M549 Dorsalgia, unspecified: Secondary | ICD-10-CM

## 2014-10-08 MED ORDER — CYCLOBENZAPRINE HCL 5 MG PO TABS: 5.0000 mg | ORAL_TABLET | Freq: Every day | ORAL | Status: DC

## 2014-10-08 NOTE — Progress Notes (Signed)
   Subjective:    Patient ID: [name], female    DOB: [birth year], 68 y.o.   MRN: [id number]  HPI   Patient seen with back pain. This is actually more the upper back region around C7-T1 region. Denies injury. Onset around August. She describes a "burning" sensation. No history of rash. Ibuprofen helps. She went for muscle massage which did not seem to help. She's not had any injury. She has continued to play golf. She sometimes uses a leaf blower (strap on type) and wonders if this may be exacerbating. No appetite or weight changes. No cough or shortness of breath. No chest pains. No upper extremity radiculopathy symptoms  Past Medical History  Diagnosis Date  . HYPOTHYROIDISM 11/13/2010  . TOBACCO ABUSE 11/13/2010  . MIGRAINE HEADACHE 11/13/2010  . SKIN CANCER, HX OF 11/13/2010  . DIVERTICULITIS, HX OF 11/13/2010   Past Surgical History  Procedure Laterality Date  . Thyroidectomy  2004    reports that she has been smoking Cigarettes.  She has a 28 pack-year smoking history. She does not have any smokeless tobacco history on file. Her alcohol and drug histories are not on file. family history includes Cancer in her maternal aunt, maternal grandmother, paternal aunt, and paternal grandmother. Allergies  Allergen Reactions  . Codeine Sulfate     REACTION: gi upset      Review of Systems  Constitutional: Negative for fever, chills, appetite change and unexpected weight change.  Respiratory: Negative for cough and shortness of breath.   Cardiovascular: Negative for chest pain.  Gastrointestinal: Negative for abdominal pain.  Neurological: Negative for weakness and numbness.       Objective:   Physical Exam  Constitutional: She appears well-developed and well-nourished.  Neck: Neck supple.  Cardiovascular: Normal rate and regular rhythm.   Pulmonary/Chest: Effort normal and breath sounds normal. No respiratory distress. She has no wheezes. She has no rales.  Musculoskeletal:    Good range of motion cervical spine. She has some tenderness around C7-T1 region but this most of this is just left of the spinal area soft tissue muscular tenderness. No overlying skin rashes  Lymphadenopathy:    She has no cervical adenopathy.          Assessment & Plan:  Nonspecific upper back pain. She denies any red flags such as fever, appetite or weight changes, or focal neurologic symptoms. Suspect muscular. Trial of topical heat and low-dose Flexeril 5 mg daily at bedtime. Touch base in 2 weeks if no better. Consider x-rays cervical and thoracic spine then if no better.

## 2014-10-08 NOTE — Patient Instructions (Signed)
Try some topical heat Be in touch if not better in 2-3 weeks.

## 2014-10-08 NOTE — Progress Notes (Signed)
Pre visit review using our clinic review tool, if applicable. No additional management support is needed unless otherwise documented below in the visit note. 

## 2014-11-13 ENCOUNTER — Other Ambulatory Visit: Payer: Self-pay | Admitting: Dermatology

## 2014-12-20 ENCOUNTER — Other Ambulatory Visit: Payer: Self-pay | Admitting: Dermatology

## 2015-04-02 ENCOUNTER — Other Ambulatory Visit: Payer: Self-pay | Admitting: Family Medicine

## 2015-04-17 ENCOUNTER — Encounter: Payer: Self-pay | Admitting: Family Medicine

## 2015-09-30 ENCOUNTER — Other Ambulatory Visit: Payer: Self-pay | Admitting: Family Medicine

## 2015-10-04 ENCOUNTER — Telehealth: Payer: Self-pay | Admitting: Family Medicine

## 2015-10-04 NOTE — Telephone Encounter (Signed)
Pt has [facility] pharm [facility] [address] in [place] needs new rx synthroid 100 mcg #90 w/refills . Pt has enough med to last until 10-07-15

## 2015-10-04 NOTE — Telephone Encounter (Signed)
Pt call back and is aware will consult w/burchette on monday

## 2015-10-04 NOTE — Telephone Encounter (Signed)
lmom for pt to call back

## 2015-10-04 NOTE — Telephone Encounter (Signed)
Patient last seen 10/08/2014. Patient needs to be seen next week to get thyroid checked and see Dr. Elease Hashimoto. Ideally patient needs a physical. Please schedule appointment.

## 2015-10-07 ENCOUNTER — Other Ambulatory Visit: Payer: Self-pay | Admitting: *Deleted

## 2015-10-07 MED ORDER — SYNTHROID 100 MCG PO TABS: ORAL_TABLET | ORAL | Status: DC

## 2015-10-07 NOTE — Telephone Encounter (Signed)
Rx sent in. Please schedule patient's physical

## 2015-10-07 NOTE — Telephone Encounter (Signed)
Please advise 

## 2015-10-07 NOTE — Telephone Encounter (Signed)
lmom for pt to call back

## 2015-10-07 NOTE — Telephone Encounter (Signed)
Pt is calling back and would like to know if md would give her 1 month supply of synthroid and will make cpx in jan 2017

## 2015-10-07 NOTE — Telephone Encounter (Signed)
Pt has sch °

## 2015-10-07 NOTE — Telephone Encounter (Signed)
Refill OK

## 2015-10-08 ENCOUNTER — Telehealth: Payer: Self-pay | Admitting: Family Medicine

## 2015-10-08 NOTE — Telephone Encounter (Signed)
Pt states medication has been straight out and no return call is needed

## 2015-10-08 NOTE — Telephone Encounter (Signed)
Ms. Eick is unable to pick up her Rx of Synthroid and is concerned about his. I spoke [facility] and she said per the pt's insurance carrier, they can't fill the Rx until February 5th. She'd like a phone call regarding this.  Pt's ph# [phone] Thank you.

## 2015-11-13 ENCOUNTER — Ambulatory Visit (INDEPENDENT_AMBULATORY_CARE_PROVIDER_SITE_OTHER): Payer: PPO | Admitting: Family Medicine

## 2015-11-13 ENCOUNTER — Encounter: Payer: Self-pay | Admitting: Family Medicine

## 2015-11-13 VITALS — BP 120/80 | HR 72 | Temp 97.5°F | Ht 65.25 in | Wt 161.9 lb

## 2015-11-13 DIAGNOSIS — Z0000 Encounter for general adult medical examination without abnormal findings: Secondary | ICD-10-CM | POA: Diagnosis not present

## 2015-11-13 LAB — CBC WITH DIFFERENTIAL/PLATELET
BASOS PCT: 0.4 % (ref 0.0–3.0)
Basophils Absolute: 0 10*3/uL (ref 0.0–0.1)
EOS ABS: 0.3 10*3/uL (ref 0.0–0.7)
EOS PCT: 3.9 % (ref 0.0–5.0)
HCT: 44.1 % (ref 36.0–46.0)
Hemoglobin: 14.5 g/dL (ref 12.0–15.0)
LYMPHS ABS: 2 10*3/uL (ref 0.7–4.0)
Lymphocytes Relative: 29.5 % (ref 12.0–46.0)
MCHC: 32.8 g/dL (ref 30.0–36.0)
MCV: 91.3 fl (ref 78.0–100.0)
Monocytes Absolute: 0.4 10*3/uL (ref 0.1–1.0)
Monocytes Relative: 5.6 % (ref 3.0–12.0)
Neutro Abs: 4.1 10*3/uL (ref 1.4–7.7)
Neutrophils Relative %: 60.6 % (ref 43.0–77.0)
PLATELETS: 255 10*3/uL (ref 150.0–400.0)
RBC: 4.83 Mil/uL (ref 3.87–5.11)
RDW: 13.8 % (ref 11.5–15.5)
WBC: 6.8 10*3/uL (ref 4.0–10.5)

## 2015-11-13 LAB — BASIC METABOLIC PANEL
BUN: 15 mg/dL (ref 6–23)
CHLORIDE: 102 meq/L (ref 96–112)
CO2: 30 mEq/L (ref 19–32)
Calcium: 9.4 mg/dL (ref 8.4–10.5)
Creatinine, Ser: 0.81 mg/dL (ref 0.40–1.20)
GFR: 74.34 mL/min (ref >60.00)
Glucose, Bld: 95 mg/dL (ref 70–99)
POTASSIUM: 4.8 meq/L (ref 3.5–5.1)
Sodium: 137 mEq/L (ref 135–145)

## 2015-11-13 LAB — LIPID PANEL
Cholesterol: 227 mg/dL — ABNORMAL HIGH (ref 0–200)
HDL: 64.9 mg/dL (ref >39.00)
LDL Cholesterol: 151 mg/dL — ABNORMAL HIGH (ref 0–99)
NonHDL: 162.02
TRIGLYCERIDES: 55 mg/dL (ref 0.0–149.0)
Total CHOL/HDL Ratio: 3
VLDL: 11 mg/dL (ref 0.0–40.0)

## 2015-11-13 LAB — HEPATIC FUNCTION PANEL
ALBUMIN: 4.3 g/dL (ref 3.5–5.2)
ALT: 15 U/L (ref 0–35)
AST: 14 U/L (ref 0–37)
Alkaline Phosphatase: 79 U/L (ref 39–117)
BILIRUBIN TOTAL: 0.4 mg/dL (ref 0.2–1.2)
Bilirubin, Direct: 0 mg/dL (ref 0.0–0.3)
Total Protein: 7.1 g/dL (ref 6.0–8.3)

## 2015-11-13 LAB — TSH: TSH: 3.42 u[IU]/mL (ref 0.35–4.50)

## 2015-11-13 NOTE — Progress Notes (Signed)
Pre visit review using our clinic review tool, if applicable. No additional management support is needed unless otherwise documented below in the visit note. 

## 2015-11-13 NOTE — Patient Instructions (Signed)
Smoking Cessation, Tips for Success If you are ready to quit smoking, congratulations! You have chosen to help yourself be healthier. Cigarettes bring nicotine, tar, carbon monoxide, and other irritants into your body. Your lungs, heart, and blood vessels will be able to work better without these poisons. There are many different ways to quit smoking. Nicotine gum, nicotine patches, a nicotine inhaler, or nicotine nasal spray can help with physical craving. Hypnosis, support groups, and medicines help break the habit of smoking. WHAT THINGS CAN I DO TO MAKE QUITTING EASIER?  Here are some tips to help you quit for good:  Pick a date when you will quit smoking completely. Tell all of your friends and family about your plan to quit on that date.  Do not try to slowly cut down on the number of cigarettes you are smoking. Pick a quit date and quit smoking completely starting on that day.  Throw away all cigarettes.   Clean and remove all ashtrays from your home, work, and car.  On a card, write down your reasons for quitting. Carry the card with you and read it when you get the urge to smoke.  Cleanse your body of nicotine. Drink enough water and fluids to keep your urine clear or pale yellow. Do this after quitting to flush the nicotine from your body.  Learn to predict your moods. Do not let a bad situation be your excuse to have a cigarette. Some situations in your life might tempt you into wanting a cigarette.  Never have "just one" cigarette. It leads to wanting another and another. Remind yourself of your decision to quit.  Change habits associated with smoking. If you smoked while driving or when feeling stressed, try other activities to replace smoking. Stand up when drinking your coffee. Brush your teeth after eating. Sit in a different chair when you read the paper. Avoid alcohol while trying to quit, and try to drink fewer caffeinated beverages. Alcohol and caffeine may urge you to  smoke.  Avoid foods and drinks that can trigger a desire to smoke, such as sugary or spicy foods and alcohol.  Ask people who smoke not to smoke around you.  Have something planned to do right after eating or having a cup of coffee. For example, plan to take a walk or exercise.  Try a relaxation exercise to calm you down and decrease your stress. Remember, you may be tense and nervous for the first 2 weeks after you quit, but this will pass.  Find new activities to keep your hands busy. Play with a pen, coin, or rubber band. Doodle or draw things on paper.  Brush your teeth right after eating. This will help cut down on the craving for the taste of tobacco after meals. You can also try mouthwash.   Use oral substitutes in place of cigarettes. Try using lemon drops, carrots, cinnamon sticks, or chewing gum. Keep them handy so they are available when you have the urge to smoke.  When you have the urge to smoke, try deep breathing.  Designate your home as a nonsmoking area.  If you are a heavy smoker, ask your health care provider about a prescription for nicotine chewing gum. It can ease your withdrawal from nicotine.  Reward yourself. Set aside the cigarette money you save and buy yourself something nice.  Look for support from others. Join a support group or smoking cessation program. Ask someone at home or at work to help you with your plan   to quit smoking.  Always ask yourself, "Do I need this cigarette or is this just a reflex?" Tell yourself, "Today, I choose not to smoke," or "I do not want to smoke." You are reminding yourself of your decision to quit.  Do not replace cigarette smoking with electronic cigarettes (commonly called e-cigarettes). The safety of e-cigarettes is unknown, and some may contain harmful chemicals.  If you relapse, do not give up! Plan ahead and think about what you will do the next time you get the urge to smoke. HOW WILL I FEEL WHEN I QUIT SMOKING? You  may have symptoms of withdrawal because your body is used to nicotine (the addictive substance in cigarettes). You may crave cigarettes, be irritable, feel very hungry, cough often, get headaches, or have difficulty concentrating. The withdrawal symptoms are only temporary. They are strongest when you first quit but will go away within 10-14 days. When withdrawal symptoms occur, stay in control. Think about your reasons for quitting. Remind yourself that these are signs that your body is healing and getting used to being without cigarettes. Remember that withdrawal symptoms are easier to treat than the major diseases that smoking can cause.  Even after the withdrawal is over, expect periodic urges to smoke. However, these cravings are generally short lived and will go away whether you smoke or not. Do not smoke! WHAT RESOURCES ARE AVAILABLE TO HELP ME QUIT SMOKING? Your health care provider can direct you to community resources or hospitals for support, which may include:  Group support.  Education.  Hypnosis.  Therapy.   This information is not intended to replace advice given to you by your health care provider. Make sure you discuss any questions you have with your health care provider.   Document Released: 07/17/2004 Document Revised: 11/09/2014 Document Reviewed: 04/06/2013 Elsevier Interactive Patient Education [employer].  Let us know if you change your mind regarding flu vaccine, DEXA scan, shingles vaccine, or low dose CT lung cancer screen.

## 2015-11-13 NOTE — Progress Notes (Signed)
Subjective:    Patient ID: [name], female    DOB: [birth year], 70 y.o.   MRN: [id number]  HPI Patient seen for physical exam. Past medical history significant for history of diverticulitis, history of MRSA current skin cancers followed by dermatology, ongoing nicotine use, hypothyroidism. She takes Synthroid but no other medications.  Still smokes about half pack cigarettes per day. Low motivation to quit.  She declines Pap smears. She is getting regular mammograms. She had one in June with recommended 6 month follow-up but she has declined to go. She plans to reschedule this. She declines several health maintenance items including shingles vaccine, flu vaccine, DEXA scan, low-dose CT chest for lung cancer screening  Past Medical History  Diagnosis Date  . HYPOTHYROIDISM 11/13/2010  . TOBACCO ABUSE 11/13/2010  . MIGRAINE HEADACHE 11/13/2010  . SKIN CANCER, HX OF 11/13/2010  . DIVERTICULITIS, HX OF 11/13/2010   Past Surgical History  Procedure Laterality Date  . Thyroidectomy  2004    reports that she has been smoking Cigarettes.  She has a 28 pack-year smoking history. She does not have any smokeless tobacco history on file. Her alcohol and drug histories are not on file. family history includes Cancer in her maternal aunt, maternal grandmother, paternal aunt, and paternal grandmother. Allergies  Allergen Reactions  . Codeine Sulfate     REACTION: gi upset       Review of Systems  Constitutional: Negative for fever, activity change, appetite change, fatigue and unexpected weight change.  HENT: Negative for ear pain, hearing loss, sore throat and trouble swallowing.   Eyes: Negative for visual disturbance.  Respiratory: Negative for cough and shortness of breath.   Cardiovascular: Negative for chest pain and palpitations.  Gastrointestinal: Negative for abdominal pain, diarrhea, constipation and blood in stool.  Endocrine: Negative for polydipsia and polyuria.    Genitourinary: Negative for dysuria and hematuria.  Musculoskeletal: Negative for myalgias, back pain and arthralgias.  Skin: Negative for rash.  Neurological: Negative for dizziness, syncope and headaches.  Hematological: Negative for adenopathy.  Psychiatric/Behavioral: Negative for confusion and dysphoric mood.       Objective:   Physical Exam  Constitutional: She is oriented to person, place, and time. She appears well-developed and well-nourished.  HENT:  Head: Normocephalic and atraumatic.  Eyes: EOM are normal. Pupils are equal, round, and reactive to light.  Neck: Normal range of motion. Neck supple. No thyromegaly present.  Cardiovascular: Normal rate, regular rhythm and normal heart sounds.   No murmur heard. Pulmonary/Chest: Breath sounds normal. No respiratory distress. She has no wheezes. She has no rales.  Abdominal: Soft. Bowel sounds are normal. She exhibits no distension and no mass. There is no tenderness. There is no rebound and no guarding.  Genitourinary:  Patient declines pelvic/Pap smear  Musculoskeletal: Normal range of motion. She exhibits no edema.  Lymphadenopathy:    She has no cervical adenopathy.  Neurological: She is alert and oriented to person, place, and time. She displays normal reflexes. No cranial nerve deficit.  Skin: No rash noted.  Psychiatric: She has a normal mood and affect. Her behavior is normal. Judgment and thought content normal.          Assessment & Plan:  Physical exam. We discussed several health maintenance items. We recommended flu vaccine, shingles vaccine, and DEXA scan and she declines all these. We discussed adequate calcium and vitamin D intake. Discussed pros and cons of low-dose CT chest lung cancer screening and she declines. Continue  regular weightbearing exercise. Patient declines Pap smear. Obtain screening lab work. Strongly encouraged to stop smoking with handouts given. Current motivation low

## 2015-12-03 DIAGNOSIS — M76822 Posterior tibial tendinitis, left leg: Secondary | ICD-10-CM | POA: Diagnosis not present

## 2015-12-03 DIAGNOSIS — M25572 Pain in left ankle and joints of left foot: Secondary | ICD-10-CM | POA: Diagnosis not present

## 2015-12-17 DIAGNOSIS — M76822 Posterior tibial tendinitis, left leg: Secondary | ICD-10-CM | POA: Diagnosis not present

## 2015-12-17 DIAGNOSIS — M19072 Primary osteoarthritis, left ankle and foot: Secondary | ICD-10-CM | POA: Diagnosis not present

## 2015-12-17 DIAGNOSIS — M71572 Other bursitis, not elsewhere classified, left ankle and foot: Secondary | ICD-10-CM | POA: Diagnosis not present

## 2015-12-25 DIAGNOSIS — M76822 Posterior tibial tendinitis, left leg: Secondary | ICD-10-CM | POA: Diagnosis not present

## 2015-12-25 DIAGNOSIS — M76821 Posterior tibial tendinitis, right leg: Secondary | ICD-10-CM | POA: Diagnosis not present

## 2015-12-25 DIAGNOSIS — M71572 Other bursitis, not elsewhere classified, left ankle and foot: Secondary | ICD-10-CM | POA: Diagnosis not present

## 2016-03-04 ENCOUNTER — Ambulatory Visit (INDEPENDENT_AMBULATORY_CARE_PROVIDER_SITE_OTHER): Payer: PPO | Admitting: Endocrinology

## 2016-03-04 ENCOUNTER — Encounter: Payer: Self-pay | Admitting: Endocrinology

## 2016-03-04 VITALS — BP 128/74 | HR 63 | Temp 97.9°F | Resp 16 | Ht 65.0 in | Wt 166.8 lb

## 2016-03-04 DIAGNOSIS — E890 Postprocedural hypothyroidism: Secondary | ICD-10-CM | POA: Diagnosis not present

## 2016-03-04 LAB — TSH: TSH: 3.38 u[IU]/mL (ref 0.35–4.50)

## 2016-03-04 LAB — T4, FREE: FREE T4: 1.29 ng/dL (ref 0.60–1.60)

## 2016-03-04 LAB — T3, FREE: T3, Free: 2.6 pg/mL (ref 2.3–4.2)

## 2016-03-04 NOTE — Progress Notes (Signed)
Quick Note:  Please let patient know that the thyroid level is normal, do not think her symptoms are related to thyroid She can follow up in 6 months or continue to see PCP, whichever she prefers ______

## 2016-03-04 NOTE — Progress Notes (Signed)
Patient ID: [name], female   DOB: Mar 07, 1946, 70 y.o.   MRN: [id number]            Reason for Appointment:  Hypothyroidism, new visit    History of Present Illness:   Hypothyroidism was first diagnosed in  2008 after thyroidectomy for her multinodular goiter   She was started on thyroid supplementation right after surgery and she had been recommended as much as 137 g by her surgeon  Subsequently she has been on 100 g daily since 01/2013 when her TSH was low, previously on 112 g     She said that she felt tired when she was tried on the generic levothyroxine and prefers to take the brand name Synthroid   She has never had a high TSH level documented in the last few years  The patient requested a consultation because of her starting to notice a weight gain of about 6-7 pounds over the last 7 weeks or so. She also has started noticing hair loss without attending of her scalp hair She does not complain of any fatigue, cold intolerance, constipation or dry skin         Patient's weight history is as follows:  Wt Readings from Last 3 Encounters:  03/04/16 166 lb 12.8 oz (75.66 kg)  11/13/15 161 lb 14.4 oz (73.437 kg)  10/08/14 164 lb (74.39 kg)    Thyroid function results have been as follows:  Lab Results  Component Value Date   TSH 3.42 11/13/2015   TSH 1.12 04/23/2014   TSH 1.62 05/08/2013     Past Medical History  Diagnosis Date  . HYPOTHYROIDISM 11/13/2010  . TOBACCO ABUSE 11/13/2010  . MIGRAINE HEADACHE 11/13/2010  . SKIN CANCER, HX OF 11/13/2010  . DIVERTICULITIS, HX OF 11/13/2010    Past Surgical History  Procedure Laterality Date  . Thyroidectomy  2004    Family History  Problem Relation Age of Onset  . Cancer Maternal Aunt     breast  . Cancer Paternal Aunt     breast  . Cancer Maternal Grandmother     breast  . Cancer Paternal Grandmother     breast    Social History:  reports that she has been smoking Cigarettes.  She has a 28 pack-year  smoking history. She does not have any smokeless tobacco history on file. Her alcohol and drug histories are not on file.  Allergies:  Allergies  Allergen Reactions  . Codeine Sulfate     REACTION: gi upset      Medication List       This list is accurate as of: 03/04/16 11:45 AM.  Always use your most recent med list.               SYNTHROID 100 MCG tablet  Generic drug:  levothyroxine  TAKE ONE TABLET BY MOUTH ONCE DAILY BEFORE  BREAKFAST        Review of Systems:  Review of Systems  HENT: Positive for trouble swallowing.        May occasionally have difficulty swallowing and will have clear her throat to make her swallow  Respiratory: Negative for daytime sleepiness and shortness of breath.   Cardiovascular: Negative for leg swelling.  Gastrointestinal: Negative for constipation.  Endocrine: Positive for abnormal weight gain.  Musculoskeletal: Negative for muscle aches and muscle cramps.  Skin:       Positive for generalized hair loss, no rash on her skin or scalp  Examination:    BP 128/74 mmHg  Pulse 63  Temp(Src) 97.9 F (36.6 C) (Oral)  Resp 16  Ht 5\' 5"  (1.651 m)  Wt 166 lb 12.8 oz (75.66 kg)  BMI 27.76 kg/m2  SpO2 96%  GENERAL: Averagely built and nourished   No pallor, clubbing, lymphadenopathy in the neck Skin:  no rash.  She has significant amount of sunburn on her upper chest and neck. No thinning of her skin, no ecchymoses or purple striae  EYES:  No swelling of the eyelids  ENT: Oral mucosa and tongue normal.  THYROID:  Not palpable.  HEART:  Normal  S1 and S2; no murmur or click.  CHEST:    Lungs: Vescicular breath sounds heard equally.  No crepitations/ wheeze.  ABDOMEN:  No distention.  Liver and spleen not palpable.  No other mass or tenderness.  NEUROLOGICAL: Reflexes are bilaterally normal at biceps, difficult to elicit at ankles.  JOINTS:  Normal.  Extremities: No edema  Assessment:  HYPOTHYROIDISM,  post surgical  She has been on fairly stable doses of levothyroxine using brand name Synthroid since 2014 She is complaining about hair loss and recent weight gain without any symptoms of unusual fatigue, cold intolerance or skin changes Does not appear to have any alopecia on exam  PLAN:  Check thyroid levels today and decide on further management  Follow-up as needed   [facility] 03/04/2016, 11:45 AM   Addendum:  Her TSH as well as free T4 and T3 are normal, she will continue same dose and discuss weight gain with PCP and hair loss with  dermatologist

## 2016-04-06 ENCOUNTER — Other Ambulatory Visit: Payer: Self-pay | Admitting: Family Medicine

## 2016-04-15 DIAGNOSIS — C44629 Squamous cell carcinoma of skin of left upper limb, including shoulder: Secondary | ICD-10-CM | POA: Diagnosis not present

## 2016-04-15 DIAGNOSIS — D485 Neoplasm of uncertain behavior of skin: Secondary | ICD-10-CM | POA: Diagnosis not present

## 2016-04-15 DIAGNOSIS — Z85828 Personal history of other malignant neoplasm of skin: Secondary | ICD-10-CM | POA: Diagnosis not present

## 2016-04-15 DIAGNOSIS — L570 Actinic keratosis: Secondary | ICD-10-CM | POA: Diagnosis not present

## 2016-06-10 DIAGNOSIS — L570 Actinic keratosis: Secondary | ICD-10-CM | POA: Diagnosis not present

## 2016-06-10 DIAGNOSIS — C44722 Squamous cell carcinoma of skin of right lower limb, including hip: Secondary | ICD-10-CM | POA: Diagnosis not present

## 2016-06-10 DIAGNOSIS — L858 Other specified epidermal thickening: Secondary | ICD-10-CM | POA: Diagnosis not present

## 2016-06-10 DIAGNOSIS — Z85828 Personal history of other malignant neoplasm of skin: Secondary | ICD-10-CM | POA: Diagnosis not present

## 2016-06-10 DIAGNOSIS — C44622 Squamous cell carcinoma of skin of right upper limb, including shoulder: Secondary | ICD-10-CM | POA: Diagnosis not present

## 2016-06-10 DIAGNOSIS — D485 Neoplasm of uncertain behavior of skin: Secondary | ICD-10-CM | POA: Diagnosis not present

## 2016-06-10 DIAGNOSIS — C44729 Squamous cell carcinoma of skin of left lower limb, including hip: Secondary | ICD-10-CM | POA: Diagnosis not present

## 2016-08-17 ENCOUNTER — Encounter: Payer: Self-pay | Admitting: Family Medicine

## 2016-08-17 DIAGNOSIS — D485 Neoplasm of uncertain behavior of skin: Secondary | ICD-10-CM | POA: Diagnosis not present

## 2016-08-17 DIAGNOSIS — L570 Actinic keratosis: Secondary | ICD-10-CM | POA: Diagnosis not present

## 2016-08-17 DIAGNOSIS — C44729 Squamous cell carcinoma of skin of left lower limb, including hip: Secondary | ICD-10-CM | POA: Diagnosis not present

## 2016-08-17 DIAGNOSIS — L821 Other seborrheic keratosis: Secondary | ICD-10-CM | POA: Diagnosis not present

## 2016-08-17 DIAGNOSIS — Z85828 Personal history of other malignant neoplasm of skin: Secondary | ICD-10-CM | POA: Diagnosis not present

## 2016-08-17 DIAGNOSIS — C44722 Squamous cell carcinoma of skin of right lower limb, including hip: Secondary | ICD-10-CM | POA: Diagnosis not present

## 2016-09-03 DIAGNOSIS — Z85828 Personal history of other malignant neoplasm of skin: Secondary | ICD-10-CM | POA: Diagnosis not present

## 2016-09-03 DIAGNOSIS — L570 Actinic keratosis: Secondary | ICD-10-CM | POA: Diagnosis not present

## 2016-09-03 DIAGNOSIS — C44722 Squamous cell carcinoma of skin of right lower limb, including hip: Secondary | ICD-10-CM | POA: Diagnosis not present

## 2016-09-24 ENCOUNTER — Other Ambulatory Visit: Payer: Self-pay | Admitting: Family Medicine

## 2016-10-08 DIAGNOSIS — Z85828 Personal history of other malignant neoplasm of skin: Secondary | ICD-10-CM | POA: Diagnosis not present

## 2016-10-08 DIAGNOSIS — L858 Other specified epidermal thickening: Secondary | ICD-10-CM | POA: Diagnosis not present

## 2016-10-08 DIAGNOSIS — C44722 Squamous cell carcinoma of skin of right lower limb, including hip: Secondary | ICD-10-CM | POA: Diagnosis not present

## 2016-11-23 DIAGNOSIS — H35363 Drusen (degenerative) of macula, bilateral: Secondary | ICD-10-CM | POA: Diagnosis not present

## 2016-12-22 ENCOUNTER — Other Ambulatory Visit: Payer: Self-pay | Admitting: Family Medicine

## 2017-01-07 ENCOUNTER — Other Ambulatory Visit (INDEPENDENT_AMBULATORY_CARE_PROVIDER_SITE_OTHER): Payer: PPO

## 2017-01-07 DIAGNOSIS — Z0000 Encounter for general adult medical examination without abnormal findings: Secondary | ICD-10-CM | POA: Diagnosis not present

## 2017-01-07 LAB — CBC WITH DIFFERENTIAL/PLATELET
BASOS PCT: 0.9 % (ref 0.0–3.0)
Basophils Absolute: 0.1 10*3/uL (ref 0.0–0.1)
Eosinophils Absolute: 0.3 10*3/uL (ref 0.0–0.7)
Eosinophils Relative: 4.4 % (ref 0.0–5.0)
HCT: 42.1 % (ref 36.0–46.0)
HEMOGLOBIN: 14.1 g/dL (ref 12.0–15.0)
Lymphocytes Relative: 34.4 % (ref 12.0–46.0)
Lymphs Abs: 2.2 10*3/uL (ref 0.7–4.0)
MCHC: 33.6 g/dL (ref 30.0–36.0)
MCV: 90.3 fl (ref 78.0–100.0)
MONOS PCT: 7 % (ref 3.0–12.0)
Monocytes Absolute: 0.5 10*3/uL (ref 0.1–1.0)
Neutro Abs: 3.5 10*3/uL (ref 1.4–7.7)
Neutrophils Relative %: 53.3 % (ref 43.0–77.0)
Platelets: 248 10*3/uL (ref 150.0–400.0)
RBC: 4.66 Mil/uL (ref 3.87–5.11)
RDW: 13.8 % (ref 11.5–15.5)
WBC: 6.5 10*3/uL (ref 4.0–10.5)

## 2017-01-07 LAB — BASIC METABOLIC PANEL
BUN: 16 mg/dL (ref 6–23)
CHLORIDE: 105 meq/L (ref 96–112)
CO2: 31 mEq/L (ref 19–32)
Calcium: 9.3 mg/dL (ref 8.4–10.5)
Creatinine, Ser: 0.81 mg/dL (ref 0.40–1.20)
GFR: 74.1 mL/min (ref >60.00)
GLUCOSE: 99 mg/dL (ref 70–99)
POTASSIUM: 4.6 meq/L (ref 3.5–5.1)
SODIUM: 140 meq/L (ref 135–145)

## 2017-01-07 LAB — HEPATIC FUNCTION PANEL
ALBUMIN: 4.1 g/dL (ref 3.5–5.2)
ALT: 19 U/L (ref 0–35)
AST: 12 U/L (ref 0–37)
Alkaline Phosphatase: 76 U/L (ref 39–117)
Bilirubin, Direct: 0.1 mg/dL (ref 0.0–0.3)
TOTAL PROTEIN: 6.5 g/dL (ref 6.0–8.3)
Total Bilirubin: 0.4 mg/dL (ref 0.2–1.2)

## 2017-01-07 LAB — LIPID PANEL
Cholesterol: 231 mg/dL — ABNORMAL HIGH (ref 0–200)
HDL: 58.4 mg/dL (ref >39.00)
LDL CALC: 157 mg/dL — AB (ref 0–99)
NONHDL: 172.18
Total CHOL/HDL Ratio: 4
Triglycerides: 77 mg/dL (ref 0.0–149.0)
VLDL: 15.4 mg/dL (ref 0.0–40.0)

## 2017-01-07 LAB — TSH: TSH: 4.72 u[IU]/mL — AB (ref 0.35–4.50)

## 2017-01-18 ENCOUNTER — Ambulatory Visit (INDEPENDENT_AMBULATORY_CARE_PROVIDER_SITE_OTHER): Payer: PPO | Admitting: Family Medicine

## 2017-01-18 ENCOUNTER — Encounter: Payer: Self-pay | Admitting: Family Medicine

## 2017-01-18 VITALS — BP 130/80 | HR 78 | Temp 98.2°F | Ht 65.0 in | Wt 163.3 lb

## 2017-01-18 DIAGNOSIS — Z0000 Encounter for general adult medical examination without abnormal findings: Secondary | ICD-10-CM | POA: Diagnosis not present

## 2017-01-18 MED ORDER — ATORVASTATIN CALCIUM 20 MG PO TABS: 20.0000 mg | ORAL_TABLET | Freq: Every day | ORAL | 3 refills | Status: DC

## 2017-01-18 NOTE — Progress Notes (Signed)
Subjective:     Patient ID: [name], female   DOB: 09-Jan-1946, 71 y.o.   MRN: [id number]  HPI Patient seen for physical exam. She has history of hypothyroidism (following previous thyroidectomy) for nontoxic multinodular goiter. She is compliant with her therapy for the most part but has skipped a couple recent doses of Synthroid. Ongoing nicotine use. Over 30 pack year history. She has history of hyperlipidemia but has declined statin use in the past. No recent chest pains.  She gets yearly mammograms and is due for repeat. She has not had previous bone density scan. Colonoscopy up-to-date.  Stays active but no formal exercise.  No recent falls.  Past Medical History:  Diagnosis Date  . DIVERTICULITIS, HX OF 11/13/2010  . HYPOTHYROIDISM 11/13/2010  . MIGRAINE HEADACHE 11/13/2010  . SKIN CANCER, HX OF 11/13/2010  . TOBACCO ABUSE 11/13/2010   Past Surgical History:  Procedure Laterality Date  . THYROIDECTOMY  2004    reports that she has been smoking Cigarettes.  She has a 28.00 pack-year smoking history. She has never used smokeless tobacco. Her alcohol and drug histories are not on file. family history includes Cancer in her maternal aunt, maternal grandmother, paternal aunt, and paternal grandmother. Allergies  Allergen Reactions  . Codeine Sulfate     REACTION: gi upset     Review of Systems  Constitutional: Negative for activity change, appetite change, fatigue, fever and unexpected weight change.  HENT: Negative for ear pain, hearing loss, sore throat and trouble swallowing.   Eyes: Negative for visual disturbance.  Respiratory: Negative for cough and shortness of breath.   Cardiovascular: Negative for chest pain and palpitations.  Gastrointestinal: Negative for abdominal pain, blood in stool, constipation and diarrhea.  Genitourinary: Negative for dysuria and hematuria.  Musculoskeletal: Negative for arthralgias, back pain and myalgias.  Skin: Negative for rash.   Neurological: Negative for dizziness, syncope and headaches.  Hematological: Negative for adenopathy.  Psychiatric/Behavioral: Negative for confusion and dysphoric mood.       Objective:   Physical Exam  Constitutional: She is oriented to person, place, and time. She appears well-developed and well-nourished.  HENT:  Head: Normocephalic and atraumatic.  Eyes: EOM are normal. Pupils are equal, round, and reactive to light.  Neck: Normal range of motion. Neck supple. No thyromegaly present.  Cardiovascular: Normal rate, regular rhythm and normal heart sounds.   No murmur heard. Pulmonary/Chest: Breath sounds normal. No respiratory distress. She has no wheezes. She has no rales.  Abdominal: Soft. Bowel sounds are normal. She exhibits no distension and no mass. There is no tenderness. There is no rebound and no guarding.  Musculoskeletal: Normal range of motion. She exhibits no edema.  Lymphadenopathy:    She has no cervical adenopathy.  Neurological: She is alert and oriented to person, place, and time. She displays normal reflexes. No cranial nerve deficit.  Skin: No rash noted.  Psychiatric: She has a normal mood and affect. Her behavior is normal. Judgment and thought content normal.       Assessment:     Physical exam. Multiple issues addressed. Labs reviewed. She has hyperlipidemia and 10 year risk of CAD event 18.34%. She is due for repeat mammogram. Never had DEXA scan but declines. Ongoing nicotine use. She meets criteria for low-dose lung cancer screen and is interested in that    Plan:     -set up repeat mammogram -Patient declines DEXA screening -Consider baby aspirin 81 mg daily -Start Lipitor 20 mg once daily -  TSH was mildly elevated and we discussed titration of Synthroid but she wishes to step up compliance with medications and reassess TSH in 3 months. If up at that point titrate her Synthroid -Set up referral for low-dose lung cancer CT scan screen -strongly  encouraged to stop smoking.  Eulas Post MD [facility] at [facility]

## 2017-01-18 NOTE — Patient Instructions (Signed)
Fat and Cholesterol Restricted Diet Getting too much fat and cholesterol in your diet may cause health problems. Following this diet helps keep your fat and cholesterol at normal levels. This can keep you from getting sick. What types of fat should I choose?  Choose monosaturated and polyunsaturated fats. These are found in foods such as olive oil, canola oil, flaxseeds, walnuts, almonds, and seeds.  Eat more omega-3 fats. Good choices include salmon, mackerel, sardines, tuna, flaxseed oil, and ground flaxseeds.  Limit saturated fats. These are in animal products such as meats, butter, and cream. They can also be in plant products such as palm oil, palm kernel oil, and coconut oil.  Avoid foods with partially hydrogenated oils in them. These contain trans fats. Examples of foods that have trans fats are stick margarine, some tub margarines, cookies, crackers, and other baked goods. What general guidelines do I need to follow?  Check food labels. Look for the words "trans fat" and "saturated fat."  When preparing a meal:  Fill half of your plate with vegetables and green salads.  Fill one fourth of your plate with whole grains. Look for the word "whole" as the first word in the ingredient list.  Fill one fourth of your plate with lean protein foods.  Eat more foods that have fiber, like apples, carrots, beans, peas, and barley.  Eat more home-cooked foods. Eat less at restaurants and buffets.  Limit or avoid alcohol.  Limit foods high in starch and sugar.  Limit fried foods.  Cook foods without frying them. Baking, boiling, grilling, and broiling are all great options.  Lose weight if you are overweight. Losing even a small amount of weight can help your overall health. It can also help prevent diseases such as diabetes and heart disease. What foods can I eat? Grains  Whole grains, such as whole wheat or whole grain breads, crackers, cereals, and pasta. Unsweetened oatmeal,  bulgur, barley, quinoa, or brown rice. Corn or whole wheat flour tortillas. Vegetables  Fresh or frozen vegetables (raw, steamed, roasted, or grilled). Green salads. Fruits  All fresh, canned (in natural juice), or frozen fruits. Meat and Other Protein Products  Ground beef (85% or leaner), grass-fed beef, or beef trimmed of fat. Skinless chicken or turkey. Ground chicken or turkey. Pork trimmed of fat. All fish and seafood. Eggs. Dried beans, peas, or lentils. Unsalted nuts or seeds. Unsalted canned or dry beans. Dairy  Low-fat dairy products, such as skim or 1% milk, 2% or reduced-fat cheeses, low-fat ricotta or cottage cheese, or plain low-fat yogurt. Fats and Oils  Tub margarines without trans fats. Light or reduced-fat mayonnaise and salad dressings. Avocado. Olive, canola, sesame, or safflower oils. Natural peanut or almond butter (choose ones without added sugar and oil). The items listed above may not be a complete list of recommended foods or beverages. Contact your dietitian for more options.  What foods are not recommended? Grains  White bread. White pasta. White rice. Cornbread. Bagels, pastries, and croissants. Crackers that contain trans fat. Vegetables  White potatoes. Corn. Creamed or fried vegetables. Vegetables in a cheese sauce. Fruits  Dried fruits. Canned fruit in light or heavy syrup. Fruit juice. Meat and Other Protein Products  Fatty cuts of meat. Ribs, chicken wings, bacon, sausage, bologna, salami, chitterlings, fatback, hot dogs, bratwurst, and packaged luncheon meats. Liver and organ meats. Dairy  Whole or 2% milk, cream, half-and-half, and cream cheese. Whole milk cheeses. Whole-fat or sweetened yogurt. Full-fat cheeses. Nondairy creamers and whipped   toppings. Processed cheese, cheese spreads, or cheese curds. Sweets and Desserts  Corn syrup, sugars, honey, and molasses. Candy. Jam and jelly. Syrup. Sweetened cereals. Cookies, pies, cakes, donuts, muffins, and ice  cream. Fats and Oils  Butter, stick margarine, lard, shortening, ghee, or bacon fat. Coconut, palm kernel, or palm oils. Beverages  Alcohol. Sweetened drinks (such as sodas, lemonade, and fruit drinks or punches). The items listed above may not be a complete list of foods and beverages to avoid. Contact your dietitian for more information.  This information is not intended to replace advice given to you by your health care provider. Make sure you discuss any questions you have with your health care provider. Document Released: 04/19/2012 Document Revised: 06/25/2016 Document Reviewed: 01/18/2014 Elsevier Interactive Patient Education  2017 [place].  Set up repeat mammogram Your get a call regarding low-dose CT lung cancer screening Consider baby aspirin 81 mg once daily Set up three-month follow-up

## 2017-01-18 NOTE — Progress Notes (Signed)
Pre visit review using our clinic review tool, if applicable. No additional management support is needed unless otherwise documented below in the visit note. 

## 2017-01-20 ENCOUNTER — Other Ambulatory Visit: Payer: Self-pay | Admitting: Acute Care

## 2017-01-20 ENCOUNTER — Encounter: Payer: Self-pay | Admitting: *Deleted

## 2017-01-20 DIAGNOSIS — F17210 Nicotine dependence, cigarettes, uncomplicated: Principal | ICD-10-CM

## 2017-01-25 DIAGNOSIS — L821 Other seborrheic keratosis: Secondary | ICD-10-CM | POA: Diagnosis not present

## 2017-01-25 DIAGNOSIS — L570 Actinic keratosis: Secondary | ICD-10-CM | POA: Diagnosis not present

## 2017-01-25 DIAGNOSIS — Z85828 Personal history of other malignant neoplasm of skin: Secondary | ICD-10-CM | POA: Diagnosis not present

## 2017-01-25 DIAGNOSIS — C44629 Squamous cell carcinoma of skin of left upper limb, including shoulder: Secondary | ICD-10-CM | POA: Diagnosis not present

## 2017-01-25 DIAGNOSIS — D485 Neoplasm of uncertain behavior of skin: Secondary | ICD-10-CM | POA: Diagnosis not present

## 2017-01-25 DIAGNOSIS — L814 Other melanin hyperpigmentation: Secondary | ICD-10-CM | POA: Diagnosis not present

## 2017-01-25 DIAGNOSIS — D1801 Hemangioma of skin and subcutaneous tissue: Secondary | ICD-10-CM | POA: Diagnosis not present

## 2017-02-08 ENCOUNTER — Encounter: Payer: Self-pay | Admitting: Acute Care

## 2017-02-08 ENCOUNTER — Ambulatory Visit (INDEPENDENT_AMBULATORY_CARE_PROVIDER_SITE_OTHER)
Admission: RE | Admit: 2017-02-08 | Discharge: 2017-02-08 | Disposition: A | Discharge status: Home / Self Care | Payer: PPO | Source: Ambulatory Visit | Attending: Acute Care | Admitting: Acute Care

## 2017-02-08 ENCOUNTER — Ambulatory Visit (INDEPENDENT_AMBULATORY_CARE_PROVIDER_SITE_OTHER): Payer: PPO | Admitting: Acute Care

## 2017-02-08 DIAGNOSIS — F17210 Nicotine dependence, cigarettes, uncomplicated: Principal | ICD-10-CM

## 2017-02-08 DIAGNOSIS — Z87891 Personal history of nicotine dependence: Secondary | ICD-10-CM | POA: Diagnosis not present

## 2017-02-08 NOTE — Progress Notes (Signed)
Shared Decision Making Visit Lung Cancer Screening Program [phone])   Eligibility:  Age 71 y.o.  Pack Years Smoking History Calculation 46 pack year smoker (# packs/per year x # years smoked)  Recent History of coughing up blood  no  Unexplained weight loss? no ( >Than 15 pounds within the last 6 months )  Prior History Lung / other cancer no (Diagnosis within the last 5 years already requiring surveillance chest CT Scans).  Smoking Status Current Smoker  Former Smokers: Years since quit:   Quit Date: NA  Visit Components:  Discussion included one or more decision making aids. yes  Discussion included risk/benefits of screening. yes  Discussion included potential follow up diagnostic testing for abnormal scans. yes  Discussion included meaning and risk of over diagnosis. yes  Discussion included meaning and risk of False Positives. yes  Discussion included meaning of total radiation exposure. yes  Counseling Included:  Importance of adherence to annual lung cancer LDCT screening. yes  Impact of comorbidities on ability to participate in the program. yes  Ability and willingness to under diagnostic treatment. yes  Smoking Cessation Counseling:  Current Smokers:   Discussed importance of smoking cessation. yes  Information about tobacco cessation classes and interventions provided to patient. yes  Patient provided with "ticket" for LDCT Scan. yes  Symptomatic Patient. no  Counseling  Diagnosis Code: Tobacco Use Z72.0  Asymptomatic Patient yes  Counseling (Intermediate counseling: > three minutes counseling) C7893  Former Smokers:   Discussed the importance of maintaining cigarette abstinence. yes  Diagnosis Code: Personal History of Nicotine Dependence. Y10.175  Information about tobacco cessation classes and interventions provided to patient. Yes  Patient provided with "ticket" for LDCT Scan. yes  Written Order for Lung Cancer Screening with  LDCT placed in Epic. Yes (CT Chest Lung Cancer Screening Low Dose W/O CM) [id number] Z12.2-Screening of respiratory organs Z87.891-Personal history of nicotine dependence  I have spent 25 minutes of face to face time with [name] discussing the risks and benefits of lung cancer screening. We viewed a power point together that explained in detail the above noted topics. We paused at intervals to allow for questions to be asked and answered to ensure understanding.We discussed that the single most powerful action that she can take to decrease her risk of developing lung cancer is to quit smoking. We discussed whether or not she is ready to commit to setting a quit date. She is currently not ready to set a quit date.We discussed options for tools to aid in quitting smoking including nicotine replacement therapy, non-nicotine medications, support groups, Quit Smart classes, and behavior modification. We discussed that often times setting smaller, more achievable goals, such as eliminating 1 cigarette a day for a week and then 2 cigarettes a day for a week can be helpful in slowly decreasing the number of cigarettes smoked. This allows for a sense of accomplishment as well as providing a clinical benefit. I gave her the " Be Stronger Than Your Excuses" card with contact information for community resources, classes, free nicotine replacement therapy, and access to mobile apps, text messaging, and on-line smoking cessation help. I have also offered her my card and contact information in the event she needs to contact me. We discussed the time and location of the scan, and that either Jordan Likes, RN  or I will call with the results within 24-48 hours of receiving them. I have provided her  with a copy of the power point we viewed  as a resource in the event they need reinforcement of the concepts we discussed today in the office. The patient verbalized understanding of all of  the above and had no further questions  upon leaving the office. They have my contact information in the event they have any further questions.  I spent 4 minutes of today's visit counseling on smoking cessation.  I explained that we have had a high incidence of CAD noted on this exam.[name] is currently on statin therapy for elevated cholesterol. I explained that this is a non-gated exam and severity of disease cannot be determined.I told her we will fax the results of the scan to her PCP for follow up of incidental findings.She verbalized understanding.   Deanna Spatz, NP 02/08/2017

## 2017-02-10 ENCOUNTER — Other Ambulatory Visit: Payer: Self-pay | Admitting: Acute Care

## 2017-02-10 DIAGNOSIS — F17210 Nicotine dependence, cigarettes, uncomplicated: Principal | ICD-10-CM

## 2017-04-26 ENCOUNTER — Other Ambulatory Visit: Payer: Self-pay | Admitting: Family Medicine

## 2017-04-26 ENCOUNTER — Ambulatory Visit: Payer: PPO | Admitting: Family Medicine

## 2017-04-26 ENCOUNTER — Other Ambulatory Visit (INDEPENDENT_AMBULATORY_CARE_PROVIDER_SITE_OTHER): Payer: PPO

## 2017-04-26 DIAGNOSIS — E039 Hypothyroidism, unspecified: Secondary | ICD-10-CM | POA: Diagnosis not present

## 2017-04-26 DIAGNOSIS — E785 Hyperlipidemia, unspecified: Secondary | ICD-10-CM | POA: Diagnosis not present

## 2017-04-26 LAB — LIPID PANEL
CHOL/HDL RATIO: 3
Cholesterol: 149 mg/dL (ref 0–200)
HDL: 55.2 mg/dL (ref >39.00)
LDL Cholesterol: 79 mg/dL (ref 0–99)
NONHDL: 93.63
Triglycerides: 73 mg/dL (ref 0.0–149.0)
VLDL: 14.6 mg/dL (ref 0.0–40.0)

## 2017-04-26 LAB — HEPATIC FUNCTION PANEL
ALT: 18 U/L (ref 0–35)
AST: 12 U/L (ref 0–37)
Albumin: 4.2 g/dL (ref 3.5–5.2)
Alkaline Phosphatase: 94 U/L (ref 39–117)
BILIRUBIN TOTAL: 0.5 mg/dL (ref 0.2–1.2)
Bilirubin, Direct: 0.1 mg/dL (ref 0.0–0.3)
TOTAL PROTEIN: 6.5 g/dL (ref 6.0–8.3)

## 2017-04-26 LAB — TSH: TSH: 2.38 u[IU]/mL (ref 0.35–4.50)

## 2017-05-24 DIAGNOSIS — Z85828 Personal history of other malignant neoplasm of skin: Secondary | ICD-10-CM | POA: Diagnosis not present

## 2017-05-24 DIAGNOSIS — D485 Neoplasm of uncertain behavior of skin: Secondary | ICD-10-CM | POA: Diagnosis not present

## 2017-05-24 DIAGNOSIS — L570 Actinic keratosis: Secondary | ICD-10-CM | POA: Diagnosis not present

## 2017-05-24 DIAGNOSIS — L821 Other seborrheic keratosis: Secondary | ICD-10-CM | POA: Diagnosis not present

## 2017-08-02 DIAGNOSIS — Z1231 Encounter for screening mammogram for malignant neoplasm of breast: Secondary | ICD-10-CM | POA: Diagnosis not present

## 2017-08-02 LAB — HM MAMMOGRAPHY

## 2017-08-31 ENCOUNTER — Encounter: Payer: Self-pay | Admitting: Family Medicine

## 2017-09-06 ENCOUNTER — Telehealth: Payer: Self-pay | Admitting: Family Medicine

## 2017-09-06 NOTE — Telephone Encounter (Signed)
Contacted patient to schedule new pt appointment with Dr. [name].  Left voicemail to call back and schedule.  TY,  -LL

## 2017-09-06 NOTE — Telephone Encounter (Signed)
OK with me.

## 2017-09-06 NOTE — Telephone Encounter (Signed)
[place] with me.  Algis Greenhouse. [name], MD 09/06/2017 9:28 AM

## 2017-09-06 NOTE — Telephone Encounter (Signed)
Patient would like to switch PCP from Dr. Elease Hashimoto at [facility] to Dr. [name] at [facility].  Please respond at your earliest convenience to acknowledge the patients request.  Thank you,  -LL

## 2017-09-14 DIAGNOSIS — L570 Actinic keratosis: Secondary | ICD-10-CM | POA: Diagnosis not present

## 2017-09-14 DIAGNOSIS — D485 Neoplasm of uncertain behavior of skin: Secondary | ICD-10-CM | POA: Diagnosis not present

## 2017-09-14 DIAGNOSIS — C44629 Squamous cell carcinoma of skin of left upper limb, including shoulder: Secondary | ICD-10-CM | POA: Diagnosis not present

## 2017-09-14 DIAGNOSIS — C44729 Squamous cell carcinoma of skin of left lower limb, including hip: Secondary | ICD-10-CM | POA: Diagnosis not present

## 2017-09-14 DIAGNOSIS — Z85828 Personal history of other malignant neoplasm of skin: Secondary | ICD-10-CM | POA: Diagnosis not present

## 2017-09-17 ENCOUNTER — Other Ambulatory Visit: Payer: Self-pay | Admitting: Family Medicine

## 2017-11-18 DIAGNOSIS — L905 Scar conditions and fibrosis of skin: Secondary | ICD-10-CM | POA: Diagnosis not present

## 2017-11-18 DIAGNOSIS — D485 Neoplasm of uncertain behavior of skin: Secondary | ICD-10-CM | POA: Diagnosis not present

## 2017-11-18 DIAGNOSIS — Z85828 Personal history of other malignant neoplasm of skin: Secondary | ICD-10-CM | POA: Diagnosis not present

## 2017-11-18 DIAGNOSIS — C44729 Squamous cell carcinoma of skin of left lower limb, including hip: Secondary | ICD-10-CM | POA: Diagnosis not present

## 2017-11-18 DIAGNOSIS — L570 Actinic keratosis: Secondary | ICD-10-CM | POA: Diagnosis not present

## 2017-11-29 DIAGNOSIS — H2513 Age-related nuclear cataract, bilateral: Secondary | ICD-10-CM | POA: Diagnosis not present

## 2018-01-20 ENCOUNTER — Encounter: Payer: Self-pay | Admitting: Family Medicine

## 2018-01-20 ENCOUNTER — Ambulatory Visit (INDEPENDENT_AMBULATORY_CARE_PROVIDER_SITE_OTHER): Payer: PPO | Admitting: Family Medicine

## 2018-01-20 VITALS — BP 128/74 | HR 74 | Temp 98.0°F | Ht 65.0 in | Wt 168.2 lb

## 2018-01-20 DIAGNOSIS — F17200 Nicotine dependence, unspecified, uncomplicated: Secondary | ICD-10-CM

## 2018-01-20 DIAGNOSIS — E785 Hyperlipidemia, unspecified: Secondary | ICD-10-CM | POA: Diagnosis not present

## 2018-01-20 DIAGNOSIS — E039 Hypothyroidism, unspecified: Secondary | ICD-10-CM

## 2018-01-20 DIAGNOSIS — Z0001 Encounter for general adult medical examination with abnormal findings: Secondary | ICD-10-CM | POA: Diagnosis not present

## 2018-01-20 DIAGNOSIS — R5383 Other fatigue: Secondary | ICD-10-CM

## 2018-01-20 DIAGNOSIS — Z1159 Encounter for screening for other viral diseases: Secondary | ICD-10-CM

## 2018-01-20 LAB — LIPID PANEL
CHOL/HDL RATIO: 4
Cholesterol: 222 mg/dL — ABNORMAL HIGH (ref 0–200)
HDL: 63.3 mg/dL (ref >39.00)
LDL CALC: 146 mg/dL — AB (ref 0–99)
NonHDL: 158.48
Triglycerides: 63 mg/dL (ref 0.0–149.0)
VLDL: 12.6 mg/dL (ref 0.0–40.0)

## 2018-01-20 LAB — CBC
HCT: 42.2 % (ref 36.0–46.0)
Hemoglobin: 14.3 g/dL (ref 12.0–15.0)
MCHC: 33.9 g/dL (ref 30.0–36.0)
MCV: 91 fl (ref 78.0–100.0)
Platelets: 244 10*3/uL (ref 150.0–400.0)
RBC: 4.64 Mil/uL (ref 3.87–5.11)
RDW: 13.8 % (ref 11.5–15.5)
WBC: 6.3 10*3/uL (ref 4.0–10.5)

## 2018-01-20 LAB — COMPREHENSIVE METABOLIC PANEL
ALT: 16 U/L (ref 0–35)
AST: 14 U/L (ref 0–37)
Albumin: 4.4 g/dL (ref 3.5–5.2)
Alkaline Phosphatase: 80 U/L (ref 39–117)
BILIRUBIN TOTAL: 0.2 mg/dL (ref 0.2–1.2)
BUN: 14 mg/dL (ref 6–23)
CO2: 26 meq/L (ref 19–32)
CREATININE: 0.77 mg/dL (ref 0.40–1.20)
Calcium: 9.3 mg/dL (ref 8.4–10.5)
Chloride: 104 mEq/L (ref 96–112)
GFR: 78.33 mL/min (ref >60.00)
GLUCOSE: 104 mg/dL — AB (ref 70–99)
Potassium: 4.5 mEq/L (ref 3.5–5.1)
Sodium: 140 mEq/L (ref 135–145)
Total Protein: 6.7 g/dL (ref 6.0–8.3)

## 2018-01-20 LAB — TSH: TSH: 3.82 u[IU]/mL (ref 0.35–4.50)

## 2018-01-20 LAB — VITAMIN B12: VITAMIN B 12: 260 pg/mL (ref 211–911)

## 2018-01-20 NOTE — Assessment & Plan Note (Signed)
Patient was asked about her tobacco use today and was strongly advised to quit. Patient is currently contemplative. We reviewed treatment options to assist her quit smoking including NRT, Chantix, and Bupropion.  Patient declined pharmacotherapy today.  Follow up at next office visit.  Total time spent counseling approximately 4 minutes.

## 2018-01-20 NOTE — Patient Instructions (Signed)
Preventive Care 72 Years and Older, Female Preventive care refers to lifestyle choices and visits with your health care provider that can promote health and wellness. What does preventive care include?  A yearly physical exam. This is also called an annual well check.  Dental exams once or twice a year.  Routine eye exams. Ask your health care provider how often you should have your eyes checked.  Personal lifestyle choices, including: ? Daily care of your teeth and gums. ? Regular physical activity. ? Eating a healthy diet. ? Avoiding tobacco and drug use. ? Limiting alcohol use. ? Practicing safe sex. ? Taking low-dose aspirin every day. ? Taking vitamin and mineral supplements as recommended by your health care provider. What happens during an annual well check? The services and screenings done by your health care provider during your annual well check will depend on your age, overall health, lifestyle risk factors, and family history of disease. Counseling Your health care provider may ask you questions about your:  Alcohol use.  Tobacco use.  Drug use.  Emotional well-being.  Home and relationship well-being.  Sexual activity.  Eating habits.  History of falls.  Memory and ability to understand (cognition).  Work and work environment.  Reproductive health.  Screening You may have the following tests or measurements:  Height, weight, and BMI.  Blood pressure.  Lipid and cholesterol levels. These may be checked every 5 years, or more frequently if you are over 50 years old.  Skin check.  Lung cancer screening. You may have this screening every year starting at age 55 if you have a 30-pack-year history of smoking and currently smoke or have quit within the past 15 years.  Fecal occult blood test (FOBT) of the stool. You may have this test every year starting at age 50.  Flexible sigmoidoscopy or colonoscopy. You may have a sigmoidoscopy every 5 years or  a colonoscopy every 10 years starting at age 50.  Hepatitis C blood test.  Hepatitis B blood test.  Sexually transmitted disease (STD) testing.  Diabetes screening. This is done by checking your blood sugar (glucose) after you have not eaten for a while (fasting). You may have this done every 1-3 years.  Bone density scan. This is done to screen for osteoporosis. You may have this done starting at age 65.  Mammogram. This may be done every 1-2 years. Talk to your health care provider about how often you should have regular mammograms.  Talk with your health care provider about your test results, treatment options, and if necessary, the need for more tests. Vaccines Your health care provider may recommend certain vaccines, such as:  Influenza vaccine. This is recommended every year.  Tetanus, diphtheria, and acellular pertussis (Tdap, Td) vaccine. You may need a Td booster every 10 years.  Varicella vaccine. You may need this if you have not been vaccinated.  Zoster vaccine. You may need this after age 60.  Measles, mumps, and rubella (MMR) vaccine. You may need at least one dose of MMR if you were born in 1957 or later. You may also need a second dose.  Pneumococcal 13-valent conjugate (PCV13) vaccine. One dose is recommended after age 65.  Pneumococcal polysaccharide (PPSV23) vaccine. One dose is recommended after age 65.  Meningococcal vaccine. You may need this if you have certain conditions.  Hepatitis A vaccine. You may need this if you have certain conditions or if you travel or work in places where you may be exposed to hepatitis   A.  Hepatitis B vaccine. You may need this if you have certain conditions or if you travel or work in places where you may be exposed to hepatitis B.  Haemophilus influenzae type b (Hib) vaccine. You may need this if you have certain conditions.  Talk to your health care provider about which screenings and vaccines you need and how often you  need them. This information is not intended to replace advice given to you by your health care provider. Make sure you discuss any questions you have with your health care provider. Document Released: 11/15/2015 Document Revised: 07/08/2016 Document Reviewed: 08/20/2015 Elsevier Interactive Patient Education  2018 Elsevier Inc.  

## 2018-01-20 NOTE — Progress Notes (Signed)
Subjective:  [name] is a 72 y.o. female who presents today for her annual comprehensive physical exam and to transfer care  HPI:  She has no acute complaints today.   She has one stable chronic condition: Hypothyroidism.  Several year history.  Stable on levothyroxine 100 mcg daily.  Occasionally feels fatigue in the afternoons.  Lifestyle Diet: No specific diets.  Exercise: Plays golf 3-4 times weekly Does a lot gardening.   Depression screen PHQ 2/9 01/20/2018  Decreased Interest 0  Down, Depressed, Hopeless 0  PHQ - 2 Score 0   Health Maintenance Due  Topic Date Due  . Hepatitis C Screening  [birth year]  . DEXA SCAN  02/01/2011     ROS: Per HPI, otherwise a complete review of systems was negative.   PMH:  The following were reviewed and entered/updated in epic: Past Medical History:  Diagnosis Date  . DIVERTICULITIS, HX OF 11/13/2010  . HYPOTHYROIDISM 11/13/2010  . MIGRAINE HEADACHE 11/13/2010  . SKIN CANCER, HX OF 11/13/2010  . TOBACCO ABUSE 11/13/2010   Patient Active Problem List   Diagnosis Date Noted  . Hypothyroidism 11/13/2010  . Nicotine dependence with current use 11/13/2010  . MIGRAINE HEADACHE 11/13/2010  . SKIN CANCER, HX OF 11/13/2010  . DIVERTICULITIS, HX OF 11/13/2010   Past Surgical History:  Procedure Laterality Date  . THYROIDECTOMY  2004   Family History  Problem Relation Age of Onset  . Cancer Maternal Aunt        breast  . Cancer Paternal Aunt        breast  . Cancer Maternal Grandmother        breast  . Cancer Paternal Grandmother        breast    Medications- reviewed and updated Current Outpatient Medications  Medication Sig Dispense Refill  . SYNTHROID 100 MCG tablet TAKE ONE TABLET BY MOUTH ONCE DAILY BEFORE BREAKFAST 90 tablet 1   No current facility-administered medications for this visit.     Allergies-reviewed and updated Allergies  Allergen Reactions  . Codeine Sulfate     REACTION: gi upset    Social  History   Socioeconomic History  . Marital status: Single    Spouse name: Not on file  . Number of children: Not on file  . Years of education: Not on file  . Highest education level: Not on file  Occupational History  . Not on file  Social Needs  . Financial resource strain: Not on file  . Food insecurity:    Worry: Not on file    Inability: Not on file  . Transportation needs:    Medical: Not on file    Non-medical: Not on file  Tobacco Use  . Smoking status: Current Every Day Smoker    Packs/day: 1.00    Years: 35.00    Pack years: 35.00    Types: Cigarettes  . Smokeless tobacco: Never Used  Substance and Sexual Activity  . Alcohol use: Not on file  . Drug use: Not on file  . Sexual activity: Not on file  Lifestyle  . Physical activity:    Days per week: Not on file    Minutes per session: Not on file  . Stress: Not on file  Relationships  . Social connections:    Talks on phone: Not on file    Gets together: Not on file    Attends religious service: Not on file    Active member of club or organization: Not  on file    Attends meetings of clubs or organizations: Not on file    Relationship status: Not on file  Other Topics Concern  . Not on file  Social History Narrative  . Not on file    Objective:  Physical Exam: BP 128/74 (BP Location: Left Arm, Patient Position: Sitting, Cuff Size: Normal)   Pulse 74   Temp 98 F (36.7 C) (Oral)   Ht 5\' 5"  (1.651 m)   Wt 168 lb 3.2 oz (76.3 kg)   SpO2 97%   BMI 27.99 kg/m   Body mass index is 27.99 kg/m. Wt Readings from Last 3 Encounters:  01/20/18 168 lb 3.2 oz (76.3 kg)  01/18/17 163 lb 4.8 oz (74.1 kg)  03/04/16 166 lb 12.8 oz (75.7 kg)   Gen: NAD, resting comfortably HEENT: TMs normal bilaterally. OP clear. No thyromegaly noted.  CV: RRR with no murmurs appreciated Pulm: NWOB, CTAB with no crackles, wheezes, or rhonchi GI: Normal bowel sounds present. Soft, Nontender, Nondistended. MSK: no edema,  cyanosis, or clubbing noted Skin: warm, dry Neuro: CN2-12 grossly intact. Strength 5/5 in upper and lower extremities. Reflexes symmetric and intact bilaterally.  Psych: Normal affect and thought content  Assessment/Plan:  Hypothyroidism Continue Synthroid.  Check TSH.  Nicotine dependence with current use Patient was asked about her tobacco use today and was strongly advised to quit. Patient is currently contemplative. We reviewed treatment options to assist her quit smoking including NRT, Chantix, and Bupropion.  Patient declined pharmacotherapy today.  Follow up at next office visit.  Total time spent counseling approximately 4 minutes.   Fatigue Check TSH as above.  Also check a CBC, CMET, and B12.  Preventative Healthcare: Declined flu and pneumonia vaccines. Check HCV antibody.   Patient Counseling:  -Nutrition: Stressed importance of moderation in sodium/caffeine intake, saturated fat and cholesterol, caloric balance, sufficient intake of fresh fruits, vegetables, and fiber.  -Stressed the importance of regular exercise.   -Substance Abuse: Discussed cessation/primary prevention of tobacco, alcohol, or other drug use; driving or other dangerous activities under the influence; availability of treatment for abuse.   -Injury prevention: Discussed safety belts, safety helmets, smoke detector, smoking near bedding or upholstery.   -Sexuality: Discussed sexually transmitted diseases, partner selection, use of condoms, avoidance of unintended pregnancy and contraceptive alternatives.   -Dental health: Discussed importance of regular tooth brushing, flossing, and dental visits.  -Health maintenance and immunizations reviewed. Please refer to Health maintenance section.  Return to care in 1 year for next preventative visit.   Algis Greenhouse. [name], MD 01/20/2018 11:13 AM

## 2018-01-20 NOTE — Assessment & Plan Note (Signed)
Continue Synthroid.  Check TSH. 

## 2018-01-21 LAB — HEPATITIS C ANTIBODY
Hepatitis C Ab: NONREACTIVE
SIGNAL TO CUT-OFF: 0.01 (ref <1.00)

## 2018-01-26 ENCOUNTER — Telehealth: Payer: Self-pay | Admitting: Family Medicine

## 2018-01-26 ENCOUNTER — Other Ambulatory Visit: Payer: Self-pay

## 2018-01-26 MED ORDER — ATORVASTATIN CALCIUM 40 MG PO TABS: 40.0000 mg | ORAL_TABLET | Freq: Every day | ORAL | 3 refills | Status: DC

## 2018-01-26 NOTE — Telephone Encounter (Signed)
Rx sent to pharmacy   

## 2018-01-26 NOTE — Telephone Encounter (Signed)
Please send in atorvastatin 40mg  daily.  Algis Greenhouse. [name], MD 01/26/2018 11:28 AM

## 2018-01-26 NOTE — Telephone Encounter (Signed)
See note

## 2018-01-26 NOTE — Telephone Encounter (Signed)
Result note read to patient; verbalizes understanding and is willing to start cholesterol medication. [facility] [address] [place].   Pt will call back to schedule F/U labs.  Result note was not routed to [facility]. Agent spoke to 'Amber' , obtained permission for [facility] to give results.

## 2018-01-26 NOTE — Telephone Encounter (Signed)
Please advise.  Which medication would you like patient to start?

## 2018-02-07 DIAGNOSIS — L570 Actinic keratosis: Secondary | ICD-10-CM | POA: Diagnosis not present

## 2018-02-07 DIAGNOSIS — C44629 Squamous cell carcinoma of skin of left upper limb, including shoulder: Secondary | ICD-10-CM | POA: Diagnosis not present

## 2018-02-07 DIAGNOSIS — C44729 Squamous cell carcinoma of skin of left lower limb, including hip: Secondary | ICD-10-CM | POA: Diagnosis not present

## 2018-02-07 DIAGNOSIS — D485 Neoplasm of uncertain behavior of skin: Secondary | ICD-10-CM | POA: Diagnosis not present

## 2018-02-07 DIAGNOSIS — Z85828 Personal history of other malignant neoplasm of skin: Secondary | ICD-10-CM | POA: Diagnosis not present

## 2018-02-07 DIAGNOSIS — D225 Melanocytic nevi of trunk: Secondary | ICD-10-CM | POA: Diagnosis not present

## 2018-02-09 ENCOUNTER — Ambulatory Visit (INDEPENDENT_AMBULATORY_CARE_PROVIDER_SITE_OTHER)
Admission: RE | Admit: 2018-02-09 | Discharge: 2018-02-09 | Disposition: A | Discharge status: Home / Self Care | Payer: PPO | Source: Ambulatory Visit | Attending: Acute Care | Admitting: Acute Care

## 2018-02-09 DIAGNOSIS — F17210 Nicotine dependence, cigarettes, uncomplicated: Secondary | ICD-10-CM

## 2018-02-14 ENCOUNTER — Other Ambulatory Visit: Payer: Self-pay | Admitting: Acute Care

## 2018-02-14 DIAGNOSIS — F17210 Nicotine dependence, cigarettes, uncomplicated: Principal | ICD-10-CM

## 2018-02-14 DIAGNOSIS — Z122 Encounter for screening for malignant neoplasm of respiratory organs: Secondary | ICD-10-CM

## 2018-03-17 ENCOUNTER — Other Ambulatory Visit: Payer: Self-pay | Admitting: Family Medicine

## 2018-04-19 ENCOUNTER — Emergency Department (HOSPITAL_COMMUNITY)
Admission: EM | Admit: 2018-04-19 | Discharge: 2018-04-19 | Disposition: A | Discharge status: Home / Self Care | Payer: PPO | Attending: Emergency Medicine | Admitting: Emergency Medicine

## 2018-04-19 ENCOUNTER — Ambulatory Visit: Payer: Self-pay | Admitting: Family Medicine

## 2018-04-19 ENCOUNTER — Ambulatory Visit (INDEPENDENT_AMBULATORY_CARE_PROVIDER_SITE_OTHER): Payer: PPO | Admitting: Family Medicine

## 2018-04-19 ENCOUNTER — Encounter: Payer: Self-pay | Admitting: Family Medicine

## 2018-04-19 ENCOUNTER — Encounter (HOSPITAL_COMMUNITY): Payer: Self-pay | Admitting: Emergency Medicine

## 2018-04-19 ENCOUNTER — Ambulatory Visit (INDEPENDENT_AMBULATORY_CARE_PROVIDER_SITE_OTHER): Payer: PPO

## 2018-04-19 ENCOUNTER — Emergency Department (HOSPITAL_COMMUNITY): Payer: PPO

## 2018-04-19 VITALS — BP 124/76 | HR 86 | Temp 98.2°F | Ht 65.0 in | Wt 165.0 lb

## 2018-04-19 DIAGNOSIS — E039 Hypothyroidism, unspecified: Secondary | ICD-10-CM | POA: Diagnosis not present

## 2018-04-19 DIAGNOSIS — R0789 Other chest pain: Secondary | ICD-10-CM

## 2018-04-19 DIAGNOSIS — Z7982 Long term (current) use of aspirin: Secondary | ICD-10-CM | POA: Diagnosis not present

## 2018-04-19 DIAGNOSIS — Z79899 Other long term (current) drug therapy: Secondary | ICD-10-CM | POA: Insufficient documentation

## 2018-04-19 DIAGNOSIS — R42 Dizziness and giddiness: Secondary | ICD-10-CM | POA: Diagnosis not present

## 2018-04-19 DIAGNOSIS — F17210 Nicotine dependence, cigarettes, uncomplicated: Secondary | ICD-10-CM | POA: Diagnosis not present

## 2018-04-19 DIAGNOSIS — R079 Chest pain, unspecified: Secondary | ICD-10-CM | POA: Diagnosis not present

## 2018-04-19 DIAGNOSIS — J439 Emphysema, unspecified: Secondary | ICD-10-CM | POA: Diagnosis not present

## 2018-04-19 DIAGNOSIS — R0602 Shortness of breath: Secondary | ICD-10-CM | POA: Diagnosis not present

## 2018-04-19 LAB — CBC
HCT: 41.8 % (ref 36.0–46.0)
HEMOGLOBIN: 13.4 g/dL (ref 12.0–15.0)
MCH: 30.1 pg (ref 26.0–34.0)
MCHC: 32.1 g/dL (ref 30.0–36.0)
MCV: 93.9 fL (ref 78.0–100.0)
PLATELETS: 240 10*3/uL (ref 150–400)
RBC: 4.45 MIL/uL (ref 3.87–5.11)
RDW: 13.8 % (ref 11.5–15.5)
WBC: 9 10*3/uL (ref 4.0–10.5)

## 2018-04-19 LAB — BASIC METABOLIC PANEL
ANION GAP: 6 (ref 5–15)
BUN: 20 mg/dL (ref 6–20)
CALCIUM: 8.8 mg/dL — AB (ref 8.9–10.3)
CHLORIDE: 107 mmol/L (ref 101–111)
CO2: 26 mmol/L (ref 22–32)
CREATININE: 0.78 mg/dL (ref 0.44–1.00)
GFR calc non Af Amer: >60 mL/min (ref >60)
Glucose, Bld: 102 mg/dL — ABNORMAL HIGH (ref 65–99)
Potassium: 3.8 mmol/L (ref 3.5–5.1)
SODIUM: 139 mmol/L (ref 135–145)

## 2018-04-19 LAB — I-STAT TROPONIN, ED
TROPONIN I, POC: 0 ng/mL (ref 0.00–0.08)
TROPONIN I, POC: 0 ng/mL (ref 0.00–0.08)

## 2018-04-19 MED ORDER — IOPAMIDOL (ISOVUE-370) INJECTION 76%
100.0000 mL | Freq: Once | INTRAVENOUS | Status: AC | PRN
  Administered 2018-04-19: 80 mL via INTRAVENOUS

## 2018-04-19 MED ORDER — IOPAMIDOL (ISOVUE-370) INJECTION 76%
INTRAVENOUS | Status: AC
  Filled 2018-04-19: qty 100

## 2018-04-19 NOTE — ED Provider Notes (Signed)
[facility] DEPT Provider Note   CSN: [id number] Arrival date & time: 04/19/18  1643     History   Chief Complaint Chief Complaint  Patient presents with  . Shortness of Breath    HPI ELLEY HARP is a 72 y.o. female.  HPI   ROXIE KREEGER is a 72 y.o. female, with a history of hypercholesterolemia and hypothyroidism, presenting to the ED with shortness of breath for about the last week. States she feels like she can't get a complete breath in. Yesterday had an episode of chest discomfort, "like someone had their hand pushing on my chest," left chest, lasted for about 2.5 hours, 2/10, nonradiating.  Endorses increased burping and intermittent lightheadedness over past week.  Denies cough, diaphoresis, N/V/D, dizziness, leg swelling or pain, abdominal pain, fever/chills, or any other complaints.   States her only recent travel was a trip to [place] at the beginning of June.  Denies history of DVT/PE, recent surgery, recent trauma, hormone use.  Past Medical History:  Diagnosis Date  . DIVERTICULITIS, HX OF 11/13/2010  . HYPOTHYROIDISM 11/13/2010  . MIGRAINE HEADACHE 11/13/2010  . SKIN CANCER, HX OF 11/13/2010  . TOBACCO ABUSE 11/13/2010    Patient Active Problem List   Diagnosis Date Noted  . Hypothyroidism 11/13/2010  . Nicotine dependence with current use 11/13/2010  . MIGRAINE HEADACHE 11/13/2010  . SKIN CANCER, HX OF 11/13/2010  . DIVERTICULITIS, HX OF 11/13/2010    Past Surgical History:  Procedure Laterality Date  . THYROIDECTOMY  2004     OB History   None      Home Medications    Prior to Admission medications   Medication Sig Start Date End Date Taking? Authorizing Provider  aspirin EC 81 MG tablet Take 324 mg by mouth daily.   Yes [provider]  atorvastatin (LIPITOR) 40 MG tablet Take 1 tablet (40 mg total) by mouth daily. 01/26/18  Yes Vivi Barrack, MD  Carboxymethylcellulose Sodium (EYE DROPS OP)  Place 1 drop into both eyes daily as needed (dry eyes).   Yes [provider]  prednisoLONE acetate (PRED FORTE) 1 % ophthalmic suspension Place 1 drop into the left eye 3 (three) times daily as needed (pain in eye, eye drainage).   Yes [provider]  SYNTHROID 100 MCG tablet TAKE ONE TABLET BY MOUTH ONCE DAILY BEFORE BREAKFAST 03/17/18  Yes Vivi Barrack, MD    Family History Family History  Problem Relation Age of Onset  . Cancer Maternal Aunt        breast  . Cancer Paternal Aunt        breast  . Cancer Maternal Grandmother        breast  . Cancer Paternal Grandmother        breast  . Cancer Mother 66       Kidney  . Atrial fibrillation Father   . Heart attack Father 18    Social History Social History   Tobacco Use  . Smoking status: Current Every Day Smoker    Packs/day: 1.00    Years: 35.00    Pack years: 35.00    Types: Cigarettes  . Smokeless tobacco: Never Used  Substance Use Topics  . Alcohol use: Not on file  . Drug use: Not on file     Allergies   Codeine sulfate   Review of Systems Review of Systems  Constitutional: Negative for chills, diaphoresis and fever.  Respiratory: Positive for shortness  of breath. Negative for cough.   Cardiovascular: Positive for chest pain. Negative for palpitations and leg swelling.  Gastrointestinal: Negative for abdominal pain, diarrhea, nausea and vomiting.  Neurological: Positive for light-headedness. Negative for syncope.  All other systems reviewed and are negative.    Physical Exam Updated Vital Signs BP (!) 141/84   Pulse 63   Temp 98.3 F (36.8 C) (Oral)   Resp 19   SpO2 96%   Physical Exam  Constitutional: She appears well-developed and well-nourished. No distress.  HENT:  Head: Normocephalic and atraumatic.  Eyes: Conjunctivae are normal.  Neck: Neck supple.  Cardiovascular: Normal rate, regular rhythm, normal heart sounds and intact distal pulses.  Pulmonary/Chest: Effort  normal and breath sounds normal. No respiratory distress.  No increased work of breathing noted.  Speaks in full sentences without difficulty.  Abdominal: Soft. There is no tenderness. There is no guarding.  Musculoskeletal: She exhibits no edema.  Lymphadenopathy:    She has no cervical adenopathy.  Neurological: She is alert.  Skin: Skin is warm and dry. She is not diaphoretic.  Psychiatric: She has a normal mood and affect. Her behavior is normal.  Nursing note and vitals reviewed.    ED Treatments / Results  Labs (all labs ordered are listed, but only abnormal results are displayed) Labs Reviewed  BASIC METABOLIC PANEL - Abnormal; Notable for the following components:      Result Value   Glucose, Bld 102 (*)    Calcium 8.8 (*)    All other components within normal limits  CBC  I-STAT TROPONIN, ED  I-STAT TROPONIN, ED    EKG EKG Interpretation  Date/Time:  Tuesday April 19 2018 17:56:25 EDT Ventricular Rate:  67 PR Interval:    QRS Duration: 78 QT Interval:  425 QTC Calculation: 449 R Axis:   53 Text Interpretation:  Sinus rhythm Anteroseptal infarct, old Confirmed by Dene Gentry [phone]) on 04/19/2018 5:59:52 PM   Radiology Dg Chest 2 View  Result Date: 04/19/2018 CLINICAL DATA:  One week of shortness of breath. Chest discomfort yesterday. Long-term smoker. Emphysema. EXAM: CHEST - 2 VIEW COMPARISON:  CT scan of the chest of February 09, 2018 and chest x-ray of November 10, 2006 FINDINGS: The lungs are well-expanded. The interstitial markings are mildly prominent which is not new. The heart and pulmonary vascularity are normal. The mediastinum is normal in width. There is calcification in the wall of the aortic arch. There is no pleural effusion. The bony thorax exhibits no acute abnormality. IMPRESSION: There is no pneumonia nor other acute cardiopulmonary abnormality. Stable chronic bronchitic-smoking related changes. Thoracic aortic atherosclerosis. Electronically Signed    By: David  Martinique M.D.   On: 04/19/2018 16:39   Ct Angio Chest Pe W And/or Wo Contrast  Result Date: 04/19/2018 CLINICAL DATA:  Shortness of breath and left chest pressure for 1 week. Smoker. EXAM: CT ANGIOGRAPHY CHEST WITH CONTRAST TECHNIQUE: Multidetector CT imaging of the chest was performed using the standard protocol during bolus administration of intravenous contrast. Multiplanar CT image reconstructions and MIPs were obtained to evaluate the vascular anatomy. CONTRAST:  80 ml ISOVUE-370 IOPAMIDOL (ISOVUE-370) INJECTION 76% COMPARISON:  Lung cancer screening chest CT 02/09/2018. FINDINGS: Cardiovascular: No pulmonary embolus is identified. Heart size is normal. No pericardial effusion. Scattered calcific aortic and coronary atherosclerotic calcifications are noted. Mediastinum/Nodes: No enlarged mediastinal, hilar, or axillary lymph nodes. Thyroid gland, trachea, and esophagus demonstrate no significant findings. Lungs/Pleura: No pleural effusion. There is some centrilobular emphysematous disease. Mild dependent  atelectasis noted. Airways unremarkable. Upper Abdomen: Negative. Musculoskeletal: Negative. Review of the MIP images confirms the above findings. IMPRESSION: Negative for pulmonary embolus or acute disease. Aortic Atherosclerosis (ICD10-I70.0) and Emphysema (ICD10-J43.9). Electronically Signed   By: Inge Rise M.D.   On: 04/19/2018 20:56    Procedures Procedures (including critical care time)  Medications Ordered in ED Medications  iopamidol (ISOVUE-370) 76 % injection (has no administration in time range)  iopamidol (ISOVUE-370) 76 % injection 100 mL (80 mLs Intravenous Contrast Given 04/19/18 2017)     Initial Impression / Assessment and Plan / ED Course  I have reviewed the triage vital signs and the nursing notes.  Pertinent labs & imaging results that were available during my care of the patient were reviewed by me and considered in my medical decision making (see chart  for details).     Patient presents shortness of breath over the last week.  Episode of chest pain yesterday. HEART score is 5, indicating moderate risk for a cardiac event. Wells criteria score is 3, indicating moderate risk for PE.  Delta troponins negative.  CT PE study negative for acute abnormality. Patient voiced resistance to admission for chest pain rule out. Shared decision making was used.  Benefits of admission were discussed as well as possible risks of discharge.  Patient opted for discharge.  Patient will follow-up with cardiology on an outpatient basis.  Return precautions discussed.  Patient voices understanding of these instructions, accepts the plan, and is comfortable with discharge.   Vitals:   04/19/18 1651 04/19/18 1851 04/19/18 1855 04/19/18 2149  BP: (!) 159/80 (!) 141/84  (!) 148/73  Pulse:  63  72  Resp: 18 19  15   Temp:   98.3 F (36.8 C)   TempSrc:   Oral   SpO2: 98% 96%  92%     Final Clinical Impressions(s) / ED Diagnoses   Final diagnoses:  SOB (shortness of breath)    ED Discharge Orders    None       Layla Maw 04/19/18 2353    Valarie Merino, MD 04/22/18 1152

## 2018-04-19 NOTE — ED Notes (Signed)
Pt sts she dosent want another EKG performed because she was only sent over here for blood work from her PCP.

## 2018-04-19 NOTE — Patient Instructions (Signed)
I am worried that you could possible be having a heart issue. You have some changes on your EKG that may indicate heart damage.  We will give you 324 mg of aspirin today.  I would like for you to go to the emergency room for further evaluation.  You will need blood work to make sure that you have not had any damage done to your heart.  You may also get blood work done to make sure you not having a blood clot.

## 2018-04-19 NOTE — Discharge Instructions (Signed)
Lab results and imaging were overall reassuring.  It is highly recommended that you follow-up with your primary care provider and cardiology for further evaluation on this matter.  Return to the ED for worsening symptoms.

## 2018-04-19 NOTE — Telephone Encounter (Signed)
See note. Dr. [name] is aware.

## 2018-04-19 NOTE — ED Triage Notes (Addendum)
Patient c/o intermittent SOB and intermittent left side chest pressure x1 week. Denies N/V/D and cough. Seen at PCP today and sent for further evaluation. Reports EKG and CXR completed today.

## 2018-04-19 NOTE — Progress Notes (Signed)
   Subjective:  [name] is a 72 y.o. female who presents today for same-day appointment with a chief complaint of shortness of breath.   HPI:  Shortness of Breath, acute problem Symptoms started about a week ago.  Symptoms significantly worsened this morning.  Had a sensation of chest heaviness yesterday morning.  She occasionally has chest pain under her left breast for the past week. The sensation of chest heaviness yesterday lasted for about an hour today.  She has had significant difficulty with breathing.  Feels like she cannot get a deep breath.  She has not tried any specific treatments for this.  Able to play golf this morning.  ROS: Per HPI, otherwise complete ROS was negative.   PMH: She reports that she has been smoking cigarettes.  She has a 35.00 pack-year smoking history. She has never used smokeless tobacco. Her alcohol and drug histories are not on file.  Past medical history significant for hyperlipidemia.  Objective:  Physical Exam: BP 124/76 (BP Location: Left Arm, Patient Position: Sitting, Cuff Size: Normal)   Pulse 86   Temp 98.2 F (36.8 C) (Oral)   Ht 5\' 5"  (1.651 m)   Wt 165 lb (74.8 kg)   SpO2 92%   BMI 27.46 kg/m   Gen: NAD, resting comfortably CV: RRR with no murmurs appreciated.  Radial and DP pulses 2+ bilaterally. Pulm: NWOB, CTAB with no crackles, wheezes, or rhonchi  EKG: NSR. Low amplitude R-waves in V1, V2 (present on previous EKGs) and V3 (not present on EKG from 2012). Blunted T wave in V2  Assessment/Plan:  Shortness of Breath/Chest Presue Patient is a very high risk for cardiac etiology.  Her chest x-ray today does not have any acute findings-we will await radiology read.  Her EKG shows normal sinus rhythm however does have poor R wave progression and a few changes from her last EKG 7 years ago.  She has several risk factors for cardiovascular disease including age, active smoking, and dyslipidemia.  Her heart score today without troponin  is 7 - indicating high risk of MACE.  Recommended patient go to the emergency room for further cardiac evaluation.  She will likely need troponin cycled to rule out ACS.  Recommended EMS transport, however patient declined.  Patient was given 324mg  of aspirin prior to leaving the office.   Patient has a HIGH level of medical complexity due to number of diagnoses/treatment options and and risk of complication.   Algis Greenhouse. [name], MD 04/19/2018 3:44 PM

## 2018-04-19 NOTE — Telephone Encounter (Signed)
Patient was seen in the office today

## 2018-04-19 NOTE — Telephone Encounter (Signed)
She called in c/o shortness of breath that she first noticed last week while working in the yard.   She played 18 holes of golf this morning and said she felt a little short of breath like she can't get in a complete breath.   Yesterday she had an episode of chest pressure "like someone pressing on my chest with their hand".    I let her know she needed to go to the ED but she did not want to go.   "I played 18 holes of golf this morning I don't feel I need to go to the ED".  She did tell me she smokes.    See triage notes.  I made her an appt with Dr. [name] for today at 3:40 with the understanding that she would go to the ED if she had any of the s/s I went over with her.   She verbalized understanding.  Reason for Disposition . [1] MILD difficulty breathing (e.g., minimal/no SOB at rest, SOB with walking, pulse <100) AND [2] NEW-onset or WORSE than normal  Answer Assessment - Initial Assessment Questions 1. RESPIRATORY STATUS: "Describe your breathing?" (e.g., wheezing, shortness of breath, unable to speak, severe coughing)      I was working last week in the yard and I noticed I was a little short of breath.    Yesterday morning I had a weight on my chest like some one pressing on my chest.   I don't feel like I'm getting my breath all the way into my longs.    I played golf this morning.   I can't get a deep breath. 2. ONSET: "When did this breathing problem begin?"      Last Wednesday working in the yard.    Sometimes I get a pain under my left breast.   I've had a lot of indigestion for the last 2-3 weeks.   I've been burping more lately like indigestion.   But the shortness of breath is new. 3. PATTERN "Does the difficult breathing come and go, or has it been constant since it started?"      It's all the time. 4. SEVERITY: "How bad is your breathing?" (e.g., mild, moderate, severe)    - MILD: No SOB at rest, mild SOB with walking, speaks normally in sentences, can lay down, no retractions,  pulse < 100.    - MODERATE: SOB at rest, SOB with minimal exertion and prefers to sit, cannot lie down flat, speaks in phrases, mild retractions, audible wheezing, pulse 100-120.    - SEVERE: Very SOB at rest, speaks in single words, struggling to breathe, sitting hunched forward, retractions, pulse > 120      I feel like my breath is not so deep.     5. RECURRENT SYMPTOM: "Have you had difficulty breathing before?" If so, ask: "When was the last time?" and "What happened that time?"      No 6. CARDIAC HISTORY: "Do you have any history of heart disease?" (e.g., heart attack, angina, bypass surgery, angioplasty)      No 7. LUNG HISTORY: "Do you have any history of lung disease?"  (e.g., pulmonary embolus, asthma, emphysema)     No  Only problem I have is thyroid.   Get physical once a year, that's it. 8. CAUSE: "What do you think is causing the breathing problem?"      No   I don't know.   I do smoke.   I've tried not to smoke  as much this week to see if that helped.    I can't tell a difference. 9. OTHER SYMPTOMS: "Do you have any other symptoms? (e.g., dizziness, runny nose, cough, chest pain, fever)     I felt light   headed yesterday during the time I felt pressure on my chest.  Did not break out in a sweat.   I felt pain in my upper back yesterday and maybe Sunday.   No pain to either arm, neck or jaw. 10. PREGNANCY: "Is there any chance you are pregnant?" "When was your last menstrual period?"       Not asked due to age 72. TRAVEL: "Have you traveled out of the country in the last month?" (e.g., travel history, exposures)       Been to beach.   End of May first week of June.  Protocols used: BREATHING DIFFICULTY-A-AH

## 2018-04-20 ENCOUNTER — Encounter: Payer: Self-pay | Admitting: Family Medicine

## 2018-04-20 DIAGNOSIS — I2510 Atherosclerotic heart disease of native coronary artery without angina pectoris: Secondary | ICD-10-CM | POA: Insufficient documentation

## 2018-04-21 ENCOUNTER — Telehealth: Payer: Self-pay | Admitting: Family Medicine

## 2018-04-21 ENCOUNTER — Other Ambulatory Visit: Payer: Self-pay

## 2018-04-21 DIAGNOSIS — R0789 Other chest pain: Secondary | ICD-10-CM

## 2018-04-21 NOTE — Telephone Encounter (Signed)
See note.   Copied from [place] [id number]. Topic: Referral - Request >> Apr 21, 2018  3:58 PM Conception Chancy, NT wrote: Reason for CRM: patient is calling and states she was seen on 04/19/18 and was told to go to the ER for blood work. She states while she was there they told her to see a cardiologist. Patient would like to know can a referral be placed. Please advise.   >> Apr 21, 2018  4:03 PM Conception Chancy, NT wrote: Patient would like to be referred to Virl Axe MD

## 2018-04-21 NOTE — Telephone Encounter (Signed)
Referral to Dr. Caryl Comes has been placed.

## 2018-04-22 NOTE — Telephone Encounter (Signed)
Patient notified

## 2018-05-02 HISTORY — PX: NM MYOVIEW LTD: HXRAD82

## 2018-05-25 ENCOUNTER — Ambulatory Visit (INDEPENDENT_AMBULATORY_CARE_PROVIDER_SITE_OTHER): Payer: PPO | Admitting: Cardiology

## 2018-05-25 ENCOUNTER — Encounter: Payer: Self-pay | Admitting: Cardiology

## 2018-05-25 VITALS — BP 116/72 | HR 71 | Ht 65.5 in | Wt 164.2 lb

## 2018-05-25 DIAGNOSIS — I25119 Atherosclerotic heart disease of native coronary artery with unspecified angina pectoris: Secondary | ICD-10-CM | POA: Diagnosis not present

## 2018-05-25 DIAGNOSIS — R072 Precordial pain: Secondary | ICD-10-CM | POA: Diagnosis not present

## 2018-05-25 DIAGNOSIS — R079 Chest pain, unspecified: Secondary | ICD-10-CM | POA: Diagnosis not present

## 2018-05-25 DIAGNOSIS — E039 Hypothyroidism, unspecified: Secondary | ICD-10-CM | POA: Diagnosis not present

## 2018-05-25 DIAGNOSIS — E785 Hyperlipidemia, unspecified: Secondary | ICD-10-CM | POA: Diagnosis not present

## 2018-05-25 DIAGNOSIS — F17200 Nicotine dependence, unspecified, uncomplicated: Secondary | ICD-10-CM

## 2018-05-25 NOTE — Progress Notes (Signed)
05/25/2018 [name]   02-11-1946  [id number]  Primary Physician Vivi Barrack, MD Primary Cardiologist: New-Dr Ellyn Hack  HPI:  Pleasant 72 y/o female with a history of HLD and smoking, referred to [place] after an ED visit for chest pain. The patient says she was in [facility] [place] when she developed mid sternal chest pressure and vaugue. SOB. She says she was going to stop by the local Fire Dept on the way home but her symptoms abated. She mentioned this episode to her husband who had her see her PCP the next day- who sent her to [facility] ED. In the ED her Troponin was negative x2. CXR showed thoracic Ao Ca++. A chest CTA showed coronary Ca++. The patient has had no further symptoms. She actually played a round of golf without symptoms before seeing her PCP on 6/19.    Current Outpatient Medications  Medication Sig Dispense Refill  . atorvastatin (LIPITOR) 40 MG tablet Take 1 tablet (40 mg total) by mouth daily. 90 tablet 3  . SYNTHROID 100 MCG tablet TAKE ONE TABLET BY MOUTH ONCE DAILY BEFORE BREAKFAST 90 tablet 3   No current facility-administered medications for this visit.     Allergies  Allergen Reactions  . Codeine Sulfate     REACTION: gi upset    Past Medical History:  Diagnosis Date  . DIVERTICULITIS, HX OF 11/13/2010  . HYPOTHYROIDISM 11/13/2010  . MIGRAINE HEADACHE 11/13/2010  . SKIN CANCER, HX OF 11/13/2010  . TOBACCO ABUSE 11/13/2010    Social History   Socioeconomic History  . Marital status: Single    Spouse name: Not on file  . Number of children: Not on file  . Years of education: Not on file  . Highest education level: Not on file  Occupational History  . Not on file  Social Needs  . Financial resource strain: Not on file  . Food insecurity:    Worry: Not on file    Inability: Not on file  . Transportation needs:    Medical: Not on file    Non-medical: Not on file  Tobacco Use  . Smoking status: Current Every Day Smoker    Packs/day: 1.00   Years: 35.00    Pack years: 35.00    Types: Cigarettes  . Smokeless tobacco: Never Used  Substance and Sexual Activity  . Alcohol use: Not on file  . Drug use: Not on file  . Sexual activity: Not on file  Lifestyle  . Physical activity:    Days per week: Not on file    Minutes per session: Not on file  . Stress: Not on file  Relationships  . Social connections:    Talks on phone: Not on file    Gets together: Not on file    Attends religious service: Not on file    Active member of club or organization: Not on file    Attends meetings of clubs or organizations: Not on file    Relationship status: Not on file  . Intimate partner violence:    Fear of current or ex partner: Not on file    Emotionally abused: Not on file    Physically abused: Not on file    Forced sexual activity: Not on file  Other Topics Concern  . Not on file  Social History Narrative  . Not on file     Family History  Problem Relation Age of Onset  . Cancer Maternal Aunt  breast  . Cancer Paternal Aunt        breast  . Cancer Maternal Grandmother        breast  . Cancer Paternal Grandmother        breast  . Cancer Mother 70       Kidney  . Atrial fibrillation Father   . Heart attack Father 22     Review of Systems: General: negative for chills, fever, night sweats or weight changes.  Cardiovascular: negative for chest pain, dyspnea on exertion, edema, orthopnea, palpitations, paroxysmal nocturnal dyspnea or shortness of breath Dermatological: negative for rash Respiratory: negative for cough or wheezing Urologic: negative for hematuria Abdominal: negative for nausea, vomiting, diarrhea, bright red blood per rectum, melena, or hematemesis Neurologic: negative for visual changes, syncope, or dizziness All other systems reviewed and are otherwise negative except as noted above.    Blood pressure 116/72, pulse 71, height 5' 5.5" (1.664 m), weight 164 lb 3.2 oz (74.5 kg).  General  appearance: alert, cooperative and no distress Neck: no carotid bruit and no JVD Lungs: clear to auscultation bilaterally Heart: regular rate and rhythm Abdomen: soft, non-tender; bowel sounds normal; no masses,  no organomegaly Extremities: extremities normal, atraumatic, no cyanosis or edema Pulses: 2+ and symmetric Skin: Skin color, texture, turgor normal. No rashes or lesions Neurologic: Grossly normal  EKG NSR  ASSESSMENT AND PLAN:   Chest pain with moderate risk of acute coronary syndrome Pt seen in the ED 04/19/18 the day following an episode of chest pain while shopping.   CAD (coronary artery disease) Aortic and coronary Ca++ seen on CTA June 2019   Nicotine dependence with current use 1/2 PPD smoker- not interested in quitting at this time.   Dyslipidemia She started statin therapy Nov 2018- her LDL was 146 on 04/20/18   PLAN  Will arrange for GXT Myoview and f/u with Dr Ellyn Hack. I discussed the importance of smoking cessation. I pointed out she has vascular disease and evidence of COPD on CXR.   Deanna Ransom PA-C 05/25/2018 3:54 PM

## 2018-05-25 NOTE — Patient Instructions (Signed)
Medication Instructions: Kerin Ransom, PA-C recommends that you continue on your current medications as directed. Please refer to the Current Medication list given to you today.  Labwork: NONE ORDERED  Testing/Procedures: 1. Exercise Myoview Stress Test - Your physician has requested that you have en exercise stress myoview. For further information please visit [email or web address]. Please follow instruction sheet, as given.  The test will take approximately 3 to 4 hours to complete; you may bring reading material.  If someone comes with you to your appointment, they will need to remain in the main lobby due to limited space in the testing area. **If you are pregnant or breastfeeding, please notify the nuclear lab prior to your appointment**  You will need to hold the following medications prior to your stress test: n/a   How to prepare for your Myocardial Perfusion Test:  Do not eat or drink 3 hours prior to your test, except you may have water.  Do not consume products containing caffeine (regular or decaffeinated) 12 hours prior to your test. (ex: coffee, chocolate, sodas, tea).  Do wear comfortable clothes (no dresses or overalls) and walking shoes, tennis shoes preferred (No heels or open toe shoes are allowed).  Do NOT wear cologne, perfume, aftershave, or lotions (deodorant is allowed).  If these instructions are not followed, your test will have to be rescheduled.  Follow-up: Lurena Joiner recommends that you schedule a follow-up appointment in 2 months with Dr Ellyn Hack.  If you need a refill on your cardiac medications before your next appointment, please call your pharmacy.

## 2018-05-25 NOTE — Assessment & Plan Note (Signed)
Aortic and coronary Ca++ seen on CTA June 2019

## 2018-05-25 NOTE — Assessment & Plan Note (Signed)
She started statin therapy Nov 2018- her LDL was 146 on 04/20/18

## 2018-05-25 NOTE — Assessment & Plan Note (Signed)
Pt seen in the ED 04/19/18 the day following an episode of chest pain while shopping.

## 2018-05-25 NOTE — Assessment & Plan Note (Signed)
1/2 PPD smoker- not interested in quitting at this time.

## 2018-05-27 ENCOUNTER — Telehealth (HOSPITAL_COMMUNITY): Payer: Self-pay

## 2018-05-27 NOTE — Telephone Encounter (Signed)
Encounter complete. 

## 2018-06-01 ENCOUNTER — Ambulatory Visit (HOSPITAL_COMMUNITY)
Admission: RE | Admit: 2018-06-01 | Discharge: 2018-06-01 | Disposition: A | Discharge status: Home / Self Care | Payer: PPO | Source: Ambulatory Visit | Attending: Cardiovascular Disease | Admitting: Cardiovascular Disease

## 2018-06-01 DIAGNOSIS — R072 Precordial pain: Secondary | ICD-10-CM | POA: Insufficient documentation

## 2018-06-01 LAB — MYOCARDIAL PERFUSION IMAGING
Estimated workload: 7.7 METS
Exercise duration (min): 6 min
Exercise duration (sec): 30 s
LV dias vol: 83 mL (ref 46–106)
LV sys vol: 37 mL
MPHR: 148 {beats}/min
Peak HR: 139 {beats}/min
Percent HR: 93 %
RPE: 19
Rest HR: 51 {beats}/min
SDS: 2
SRS: 0
SSS: 2
TID: 1.06

## 2018-06-01 MED ORDER — TECHNETIUM TC 99M TETROFOSMIN IV KIT
9.9000 | PACK | Freq: Once | INTRAVENOUS | Status: AC | PRN
  Administered 2018-06-01: 9.9 via INTRAVENOUS
  Filled 2018-06-01: qty 10

## 2018-06-01 MED ORDER — TECHNETIUM TC 99M TETROFOSMIN IV KIT
31.3000 | PACK | Freq: Once | INTRAVENOUS | Status: AC | PRN
  Administered 2018-06-01: 31.3 via INTRAVENOUS
  Filled 2018-06-01: qty 32

## 2018-06-27 DIAGNOSIS — L821 Other seborrheic keratosis: Secondary | ICD-10-CM | POA: Diagnosis not present

## 2018-06-27 DIAGNOSIS — L570 Actinic keratosis: Secondary | ICD-10-CM | POA: Diagnosis not present

## 2018-06-27 DIAGNOSIS — D485 Neoplasm of uncertain behavior of skin: Secondary | ICD-10-CM | POA: Diagnosis not present

## 2018-06-27 DIAGNOSIS — Z85828 Personal history of other malignant neoplasm of skin: Secondary | ICD-10-CM | POA: Diagnosis not present

## 2018-06-27 DIAGNOSIS — L814 Other melanin hyperpigmentation: Secondary | ICD-10-CM | POA: Diagnosis not present

## 2018-06-27 DIAGNOSIS — D0462 Carcinoma in situ of skin of left upper limb, including shoulder: Secondary | ICD-10-CM | POA: Diagnosis not present

## 2018-06-27 DIAGNOSIS — D1801 Hemangioma of skin and subcutaneous tissue: Secondary | ICD-10-CM | POA: Diagnosis not present

## 2018-08-08 ENCOUNTER — Encounter: Payer: Self-pay | Admitting: Cardiology

## 2018-08-08 ENCOUNTER — Ambulatory Visit: Payer: PPO | Admitting: Cardiology

## 2018-08-08 VITALS — BP 126/76 | HR 61 | Ht 66.0 in | Wt 164.0 lb

## 2018-08-08 DIAGNOSIS — E785 Hyperlipidemia, unspecified: Secondary | ICD-10-CM

## 2018-08-08 DIAGNOSIS — Z1231 Encounter for screening mammogram for malignant neoplasm of breast: Secondary | ICD-10-CM | POA: Diagnosis not present

## 2018-08-08 DIAGNOSIS — I2510 Atherosclerotic heart disease of native coronary artery without angina pectoris: Secondary | ICD-10-CM

## 2018-08-08 DIAGNOSIS — R072 Precordial pain: Secondary | ICD-10-CM

## 2018-08-08 NOTE — Progress Notes (Signed)
PCP: No primary care provider on file.  Clinic Note: Chief Complaint  Patient presents with  . Follow-up    Stress test results    HPI: [name] is a 72 y.o. female with a PMH below who presents today for 2 month f/u evaluation of Precordial CP --> following up results of stress test..  [name] was seen on 05/25/2018 by Deanna Ransom, PA at the request of Deanna Barrack, MD (& ER f/u) for an episode of chest pain while shucking corn in [place].  Noted mid substernal pain with vague shortness of breath..  Chest CTA showed coronary calcification.  Recent Hospitalizations: n/a -none since ER visit April 19, 2018.  Studies Personally Reviewed - (if available, images/films reviewed: From Epic Chart or Care Everywhere)  Myoview ST 06/01/2018:  EF~55%. Normal BP response.  No EKG change.  LOW RISK. No Ischemia or Infarction.  (Mild Breast Attenuation)  Interval History: [name] presents here today to follow-up after stress test result.  She says she has not had any more episodes of chest pain since the one in [place].  She is been doing fine.  Only some mild exertional dyspnea because of deconditioning.  No chest pain or shortness of breath with rest or exertion. No PND, orthopnea or edema. No palpitations, lightheadedness, dizziness, weakness or syncope/near syncope. No TIA/amaurosis fugax symptoms. No claudication.  Review of Systems  Constitutional: Negative for malaise/fatigue and weight loss.  HENT: Negative for congestion and nosebleeds.   Respiratory: Positive for cough (Off and on) and shortness of breath (Mild baseline dyspnea). Negative for wheezing.   Gastrointestinal: Negative for blood in stool, heartburn and melena.  Genitourinary: Negative for hematuria.  Musculoskeletal: Negative for back pain and joint pain.   I have reviewed and (if needed) personally updated the patient's problem list, medications, allergies, past medical and surgical history, social and family  history.   Past Medical History:  Diagnosis Date  . DIVERTICULITIS, HX OF 11/13/2010  . HYPOTHYROIDISM 11/13/2010  . MIGRAINE HEADACHE 11/13/2010  . SKIN CANCER, HX OF 11/13/2010  . TOBACCO ABUSE 11/13/2010    Past Surgical History:  Procedure Laterality Date  . NM MYOVIEW LTD  05/2018    EF~55%. Normal BP response.  No EKG change.  LOW RISK. No Ischemia or Infarction.  (Mild Breast Attenuation)  . THYROIDECTOMY  2004    Current Meds  Medication Sig  . atorvastatin (LIPITOR) 40 MG tablet Take 1 tablet (40 mg total) by mouth daily.  Marland Kitchen SYNTHROID 100 MCG tablet TAKE ONE TABLET BY MOUTH ONCE DAILY BEFORE BREAKFAST    Allergies  Allergen Reactions  . Codeine Sulfate     REACTION: gi upset    Social History   Tobacco Use  . Smoking status: Current Every Day Smoker    Packs/day: 1.00    Years: 35.00    Pack years: 35.00    Types: Cigarettes  . Smokeless tobacco: Never Used  Substance Use Topics  . Alcohol use: Not on file  . Drug use: Not on file   Social History   Social History Narrative  . Not on file    family history includes Atrial fibrillation in her father; Cancer in her maternal aunt, maternal grandmother, paternal aunt, and paternal grandmother; Cancer (age of onset: 19) in her mother; Heart attack (age of onset: 16) in her father.  Wt Readings from Last 3 Encounters:  08/08/18 164 lb (74.4 kg)  06/01/18 164 lb (74.4 kg)  05/25/18 164  lb 3.2 oz (74.5 kg)    PHYSICAL EXAM BP 126/76   Pulse 61   Ht 5\' 6"  (1.676 m)   Wt 164 lb (74.4 kg)   BMI 26.47 kg/m  Physical Exam  Constitutional: She is oriented to person, place, and time. She appears well-developed and well-nourished.  Healthy-appearing woman.  No acute distress  HENT:  Head: Normocephalic and atraumatic.  Neck: Normal range of motion. Neck supple. No hepatojugular reflux and no JVD present. Carotid bruit is not present.  Cardiovascular: Normal rate, regular rhythm, normal heart sounds and intact  distal pulses. PMI is not displaced. Exam reveals no gallop and no friction rub.  No murmur heard. Pulmonary/Chest: Effort normal and breath sounds normal. No respiratory distress. She has no wheezes. She has no rales.  Musculoskeletal: Normal range of motion. She exhibits no edema.  Neurological: She is alert and oriented to person, place, and time.  Psychiatric: She has a normal mood and affect. Her behavior is normal. Judgment and thought content normal.  Vitals reviewed.    Adult ECG Report Not checked  Other studies Reviewed: Additional studies/ records that were reviewed today include:  Recent Labs:   Lab Results  Component Value Date   CREATININE 0.78 04/19/2018   BUN 20 04/19/2018   NA 139 04/19/2018   K 3.8 04/19/2018   CL 107 04/19/2018   CO2 26 04/19/2018   Lab Results  Component Value Date   CHOL 222 (H) 01/20/2018   HDL 63.30 01/20/2018   LDLCALC 146 (H) 01/20/2018   LDLDIRECT 160.4 04/01/2011   TRIG 63.0 01/20/2018   CHOLHDL 4 01/20/2018    ASSESSMENT / PLAN:  No further chest pain.  Myoview suggests nonischemic CAD.  Recommendation will be for her to follow-up with her PCP to manage risk factors.  Most notable risk factor right now is hyperlipidemia.  Has not had labs checked since March but was not at goal at that time.  Would defer management to PCP, but if additional assistance is needed we can refer to our office lipid clinic ([facility]).   Problem List Items Addressed This Visit    Coronary artery calcification seen on CAT scan - Primary (Chronic)    Negative Myoview would argue against any potential ischemic disease.  No further chest pain. This would warrant more aggressive management of risk factors.  She is now on statin.  Target LDL would be less than 100 goal less than 70.  Maintain adequate blood pressure, weight and glycemic control as well.      Hyperlipidemia with target LDL less than 100 (Chronic)    Was  started on statin in November 2018.  LDL still not very well controlled in March 2019.  Continue to follow.  If not out of the controlled would consider either switching to Crestor and Zetia versus referral for lipid clinic management.      Precordial pain    Only one episode of chest pain.  No further symptoms.  Negative stress test would argue against ischemic etiology.        Current medicines are reviewed at length with the patient today.  (+/- concerns) N/A The following changes have been made:  N/A  Patient Instructions  NO MEDICATION CHANGES   Your physician recommends that you schedule a follow-up appointment in ON AS NEEDED BASIS    Studies Ordered:   No orders of the defined types were placed in this encounter.     Glenetta Hew,  M.D., M.S. Interventional Cardiologist   Pager # [phone] Phone # [phone] [address]. [place], [place] [address]   Thank you for choosing [facility]!!

## 2018-08-08 NOTE — Patient Instructions (Signed)
NO MEDICATION CHANGES   Your physician recommends that you schedule a follow-up appointment in ON AS NEEDED BASIS

## 2018-08-10 ENCOUNTER — Encounter: Payer: Self-pay | Admitting: Cardiology

## 2018-08-10 NOTE — Assessment & Plan Note (Signed)
Only one episode of chest pain.  No further symptoms.  Negative stress test would argue against ischemic etiology.

## 2018-08-10 NOTE — Assessment & Plan Note (Signed)
Was started on statin in November 2018.  LDL still not very well controlled in March 2019.  Continue to follow.  If not out of the controlled would consider either switching to Crestor and Zetia versus referral for lipid clinic management.

## 2018-08-10 NOTE — Assessment & Plan Note (Signed)
Negative Myoview would argue against any potential ischemic disease.  No further chest pain. This would warrant more aggressive management of risk factors.  She is now on statin.  Target LDL would be less than 100 goal less than 70.  Maintain adequate blood pressure, weight and glycemic control as well.

## 2018-08-18 DIAGNOSIS — R922 Inconclusive mammogram: Secondary | ICD-10-CM | POA: Diagnosis not present

## 2018-08-18 DIAGNOSIS — R921 Mammographic calcification found on diagnostic imaging of breast: Secondary | ICD-10-CM | POA: Diagnosis not present

## 2018-08-18 LAB — HM MAMMOGRAPHY

## 2018-08-25 ENCOUNTER — Encounter: Payer: Self-pay | Admitting: Physical Therapy

## 2018-10-17 DIAGNOSIS — Z85828 Personal history of other malignant neoplasm of skin: Secondary | ICD-10-CM | POA: Diagnosis not present

## 2018-10-17 DIAGNOSIS — D485 Neoplasm of uncertain behavior of skin: Secondary | ICD-10-CM | POA: Diagnosis not present

## 2018-10-17 DIAGNOSIS — L821 Other seborrheic keratosis: Secondary | ICD-10-CM | POA: Diagnosis not present

## 2018-10-17 DIAGNOSIS — C44729 Squamous cell carcinoma of skin of left lower limb, including hip: Secondary | ICD-10-CM | POA: Diagnosis not present

## 2018-10-17 DIAGNOSIS — L570 Actinic keratosis: Secondary | ICD-10-CM | POA: Diagnosis not present

## 2018-10-17 DIAGNOSIS — D0462 Carcinoma in situ of skin of left upper limb, including shoulder: Secondary | ICD-10-CM | POA: Diagnosis not present

## 2018-10-17 DIAGNOSIS — D045 Carcinoma in situ of skin of trunk: Secondary | ICD-10-CM | POA: Diagnosis not present

## 2018-11-30 DIAGNOSIS — H35363 Drusen (degenerative) of macula, bilateral: Secondary | ICD-10-CM | POA: Diagnosis not present

## 2019-01-06 DIAGNOSIS — H57813 Brow ptosis, bilateral: Secondary | ICD-10-CM | POA: Diagnosis not present

## 2019-01-06 DIAGNOSIS — H02831 Dermatochalasis of right upper eyelid: Secondary | ICD-10-CM | POA: Diagnosis not present

## 2019-01-06 DIAGNOSIS — Z01818 Encounter for other preprocedural examination: Secondary | ICD-10-CM | POA: Diagnosis not present

## 2019-01-11 DIAGNOSIS — Z01818 Encounter for other preprocedural examination: Secondary | ICD-10-CM | POA: Diagnosis not present

## 2019-01-12 ENCOUNTER — Other Ambulatory Visit: Payer: Self-pay

## 2019-01-12 MED ORDER — ATORVASTATIN CALCIUM 40 MG PO TABS: 40.0000 mg | ORAL_TABLET | Freq: Every day | ORAL | 3 refills | Status: DC

## 2019-01-23 ENCOUNTER — Encounter: Payer: PPO | Admitting: Family Medicine

## 2019-02-21 DIAGNOSIS — L720 Epidermal cyst: Secondary | ICD-10-CM | POA: Diagnosis not present

## 2019-02-21 DIAGNOSIS — Z85828 Personal history of other malignant neoplasm of skin: Secondary | ICD-10-CM | POA: Diagnosis not present

## 2019-02-21 DIAGNOSIS — D485 Neoplasm of uncertain behavior of skin: Secondary | ICD-10-CM | POA: Diagnosis not present

## 2019-02-21 DIAGNOSIS — L570 Actinic keratosis: Secondary | ICD-10-CM | POA: Diagnosis not present

## 2019-02-21 DIAGNOSIS — L821 Other seborrheic keratosis: Secondary | ICD-10-CM | POA: Diagnosis not present

## 2019-03-03 ENCOUNTER — Telehealth: Payer: Self-pay | Admitting: Family Medicine

## 2019-03-07 ENCOUNTER — Other Ambulatory Visit: Payer: Self-pay

## 2019-03-07 MED ORDER — SYNTHROID 100 MCG PO TABS: ORAL_TABLET | ORAL | 0 refills | Status: DC

## 2019-03-07 NOTE — Telephone Encounter (Signed)
[name], with [facility], states that patients insurance prefers this medication to be sent in as 3 mo supply. She inquired if this can be changed or if patient will need follow up appointment before change can occur.

## 2019-03-07 NOTE — Telephone Encounter (Signed)
Rx sent to pharmacy   

## 2019-03-07 NOTE — Telephone Encounter (Signed)
See note

## 2019-04-17 ENCOUNTER — Other Ambulatory Visit: Payer: Self-pay

## 2019-04-17 ENCOUNTER — Ambulatory Visit (INDEPENDENT_AMBULATORY_CARE_PROVIDER_SITE_OTHER): Payer: PPO | Admitting: Family Medicine

## 2019-04-17 ENCOUNTER — Encounter: Payer: Self-pay | Admitting: Family Medicine

## 2019-04-17 DIAGNOSIS — R921 Mammographic calcification found on diagnostic imaging of breast: Secondary | ICD-10-CM | POA: Diagnosis not present

## 2019-04-17 DIAGNOSIS — H02831 Dermatochalasis of right upper eyelid: Secondary | ICD-10-CM | POA: Diagnosis not present

## 2019-04-17 LAB — HM MAMMOGRAPHY

## 2019-04-17 NOTE — Progress Notes (Signed)
I will bring you the dismissal form for you to fill out and give to Lea to process.   Thanks

## 2019-04-17 NOTE — Progress Notes (Signed)
Patient presented today for CPE.  All in the exam room, she admitted to having sore throat and difficulty swallowing to our CMA.  Due to ongoing [place] pandemic, patient was informed that we are not currently seeing any symptomatic patients in the office.  She was then instructed to reschedule virtual visit to discuss her sore throat.  Patient voiced understanding and left through the side door.  Patient was not seen or evaluated by me.  Algis Greenhouse. [name], MD 04/17/2019 8:47 AM

## 2019-04-17 NOTE — Progress Notes (Signed)
Maddy asked the patient and she said no she did not have any of the symptoms. Isla Pence can ask her to reschedule her physical if you would like due to her symptoms and Isla Pence can schedule a virtual visit for the sore throat. thanks

## 2019-05-02 ENCOUNTER — Encounter: Payer: Self-pay | Admitting: Family Medicine

## 2019-05-04 DIAGNOSIS — H02831 Dermatochalasis of right upper eyelid: Secondary | ICD-10-CM | POA: Diagnosis not present

## 2019-05-04 DIAGNOSIS — H02834 Dermatochalasis of left upper eyelid: Secondary | ICD-10-CM | POA: Diagnosis not present

## 2019-05-11 IMAGING — CT CT ANGIO CHEST
2 of 6 series · 19 of 36 positions shown · IV contrast (ISOVUE)
Comparison: Lung cancer screening chest CT 02/09/2018.

CLINICAL DATA: Shortness of breath and left chest pressure for 1
week. Smoker.

EXAM:
CT ANGIOGRAPHY CHEST WITH CONTRAST
TECHNIQUE: Multidetector CT imaging of the chest was performed using the
standard protocol during bolus administration of intravenous
contrast. Multiplanar CT image reconstructions and MIPs were
obtained to evaluate the vascular anatomy.
CONTRAST:  80 ml JVMCV8-7JI IOPAMIDOL ([id number]) INJECTION 76%

[Series 5: thins · axial · 0.67mm/px · z∈[+1445,+1709]mm · 18 of 294 slices shown]
[im 15/294  lung]
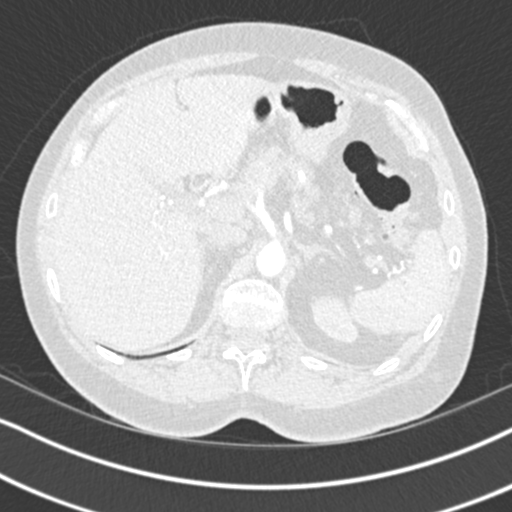
[im 30/294  mediastinal]
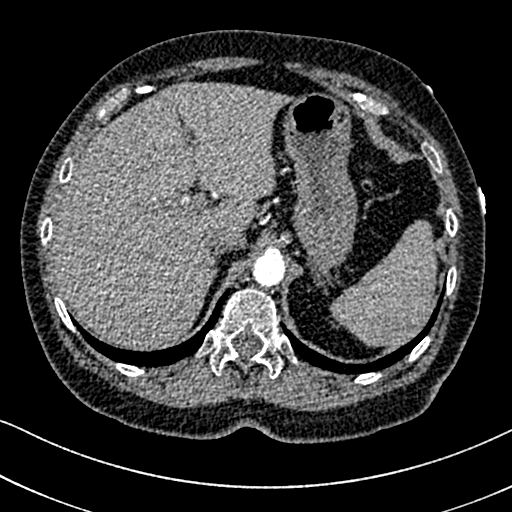
[im 44/294  lung]
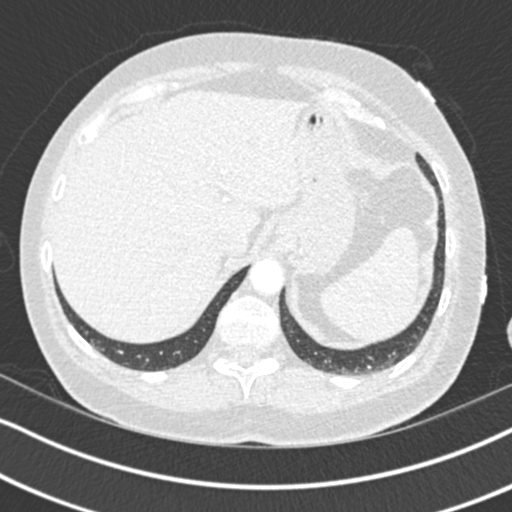
[im 59/294  mediastinal]
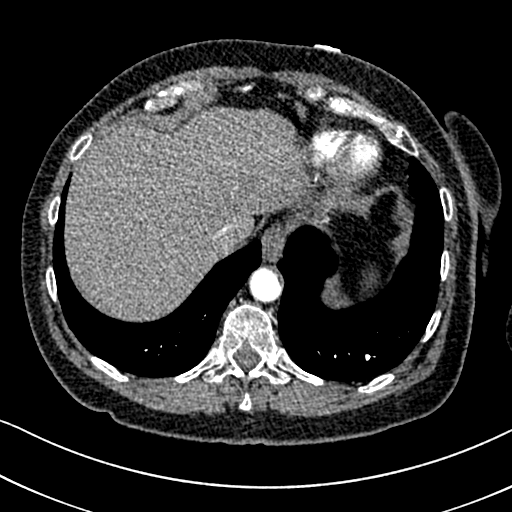
[im 74/294  lung]
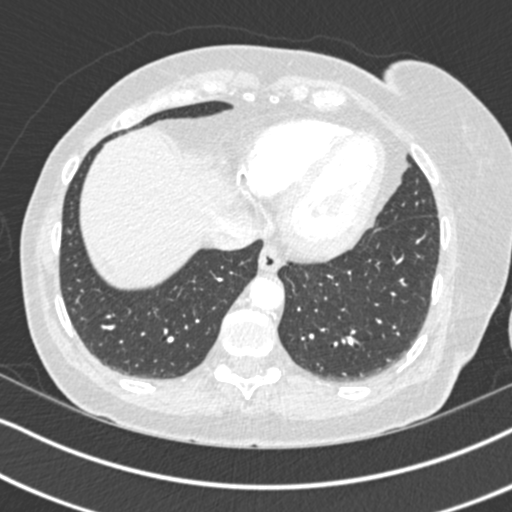
[im 88/294  mediastinal]
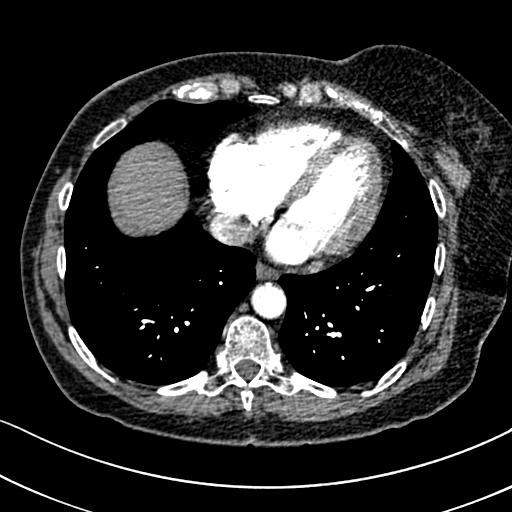
[im 103/294  lung]
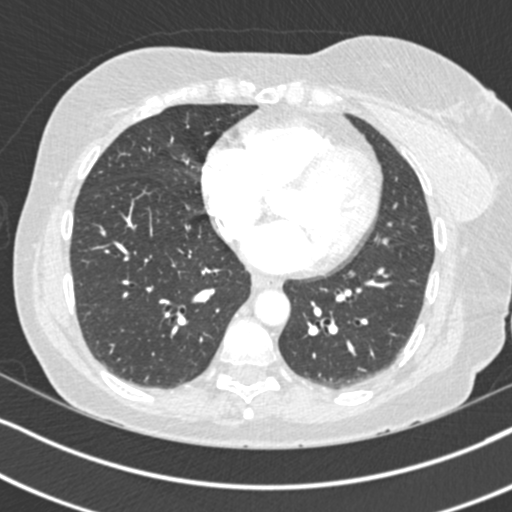
[im 118/294  mediastinal]
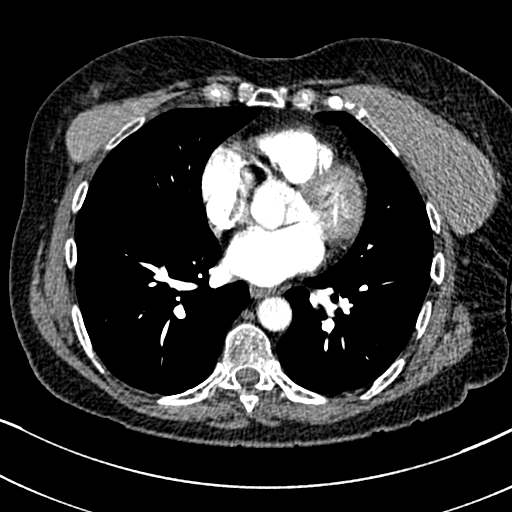
[im 132/294  lung]
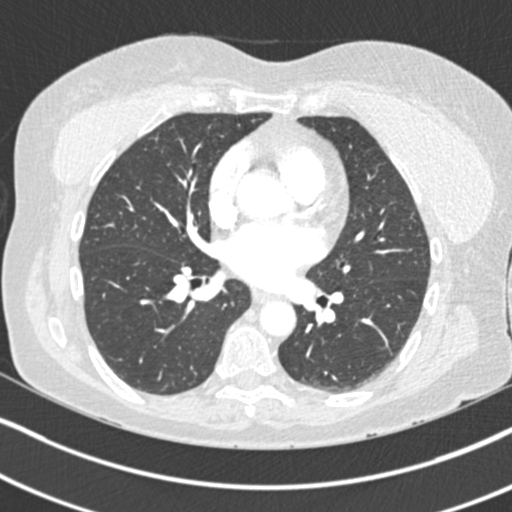
[im 162/294  mediastinal]
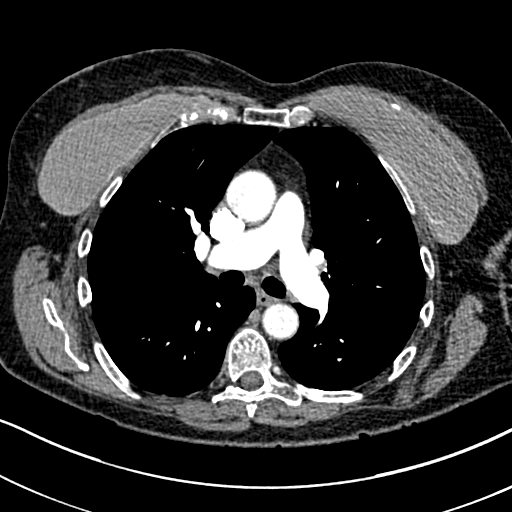
[im 176/294  lung]
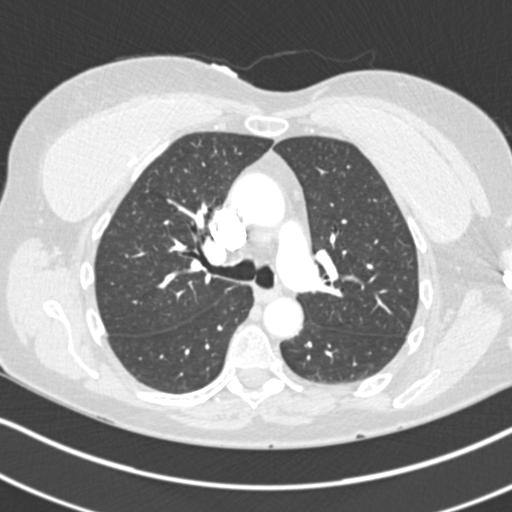
[im 191/294  mediastinal]
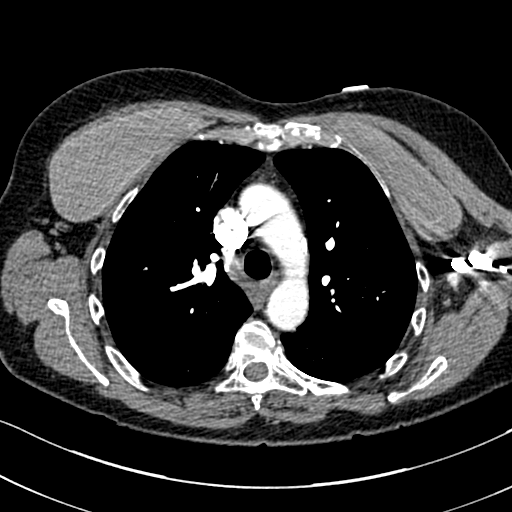
[im 206/294  lung]
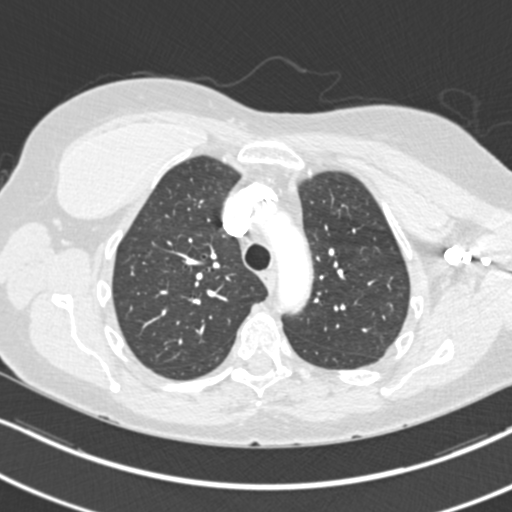
[im 220/294  mediastinal]
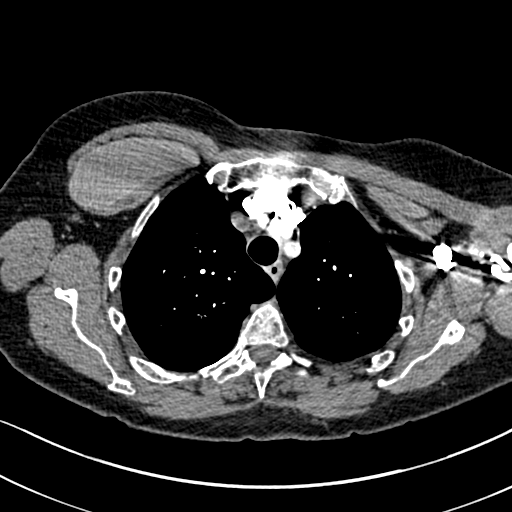
[im 235/294  lung]
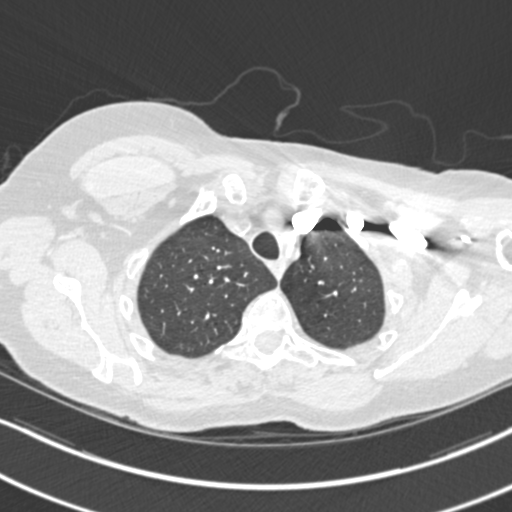
[im 250/294  mediastinal]
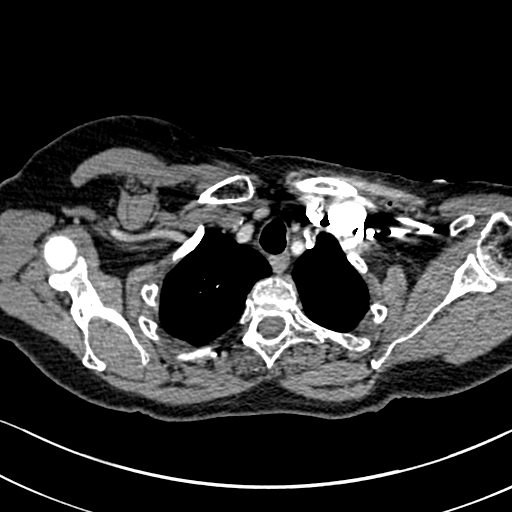
[im 264/294  lung]
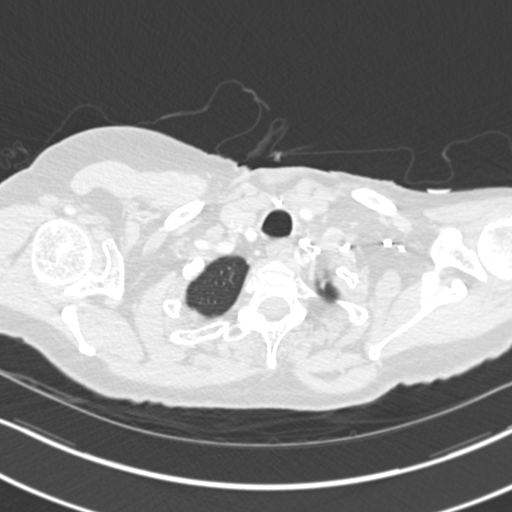
[im 279/294  mediastinal]
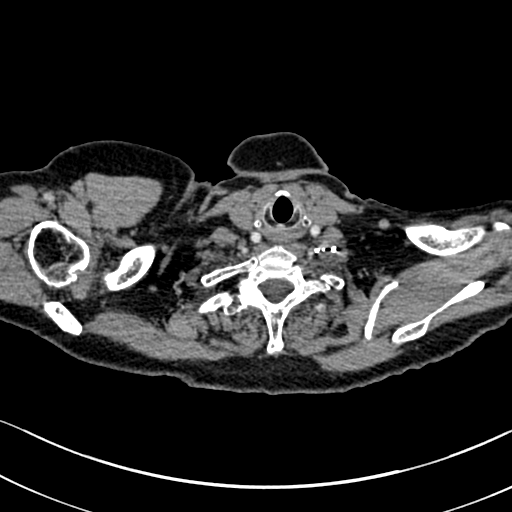

[Series 7: coronal mpr · coronal · 0.59mm/px · 1 of 140 slices shown]
[im 70/140  mediastinal]
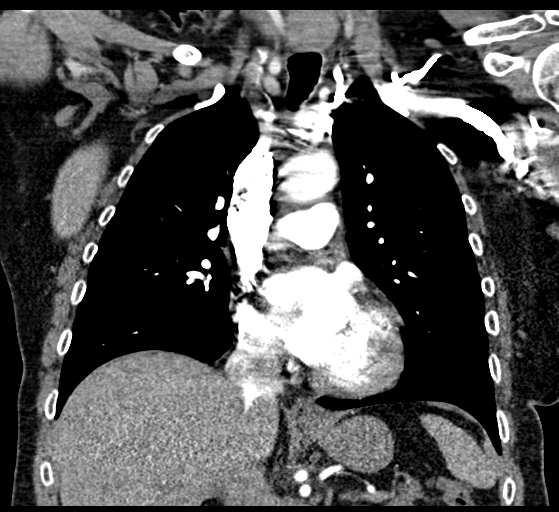

[19 of 36 positions shown; findings below may reference images not displayed]

FINDINGS: Cardiovascular: No pulmonary embolus is identified. Heart size is
normal. No pericardial effusion. Scattered calcific aortic and
coronary atherosclerotic calcifications are noted.

Mediastinum/Nodes: No enlarged mediastinal, hilar, or axillary lymph
nodes. Thyroid gland, trachea, and esophagus demonstrate no
significant findings.

Lungs/Pleura: No pleural effusion. There is some centrilobular
emphysematous disease. Mild dependent atelectasis noted. Airways
unremarkable.

Upper Abdomen: Negative.

Musculoskeletal: Negative.

Review of the MIP images confirms the above findings.
IMPRESSION: Negative for pulmonary embolus or acute disease.

Aortic Atherosclerosis (ZDCES-UUN.N) and Emphysema (ZDCES-YSW.B).

## 2019-05-25 ENCOUNTER — Other Ambulatory Visit: Payer: Self-pay | Admitting: *Deleted

## 2019-05-25 DIAGNOSIS — Z122 Encounter for screening for malignant neoplasm of respiratory organs: Secondary | ICD-10-CM

## 2019-05-25 DIAGNOSIS — Z87891 Personal history of nicotine dependence: Secondary | ICD-10-CM

## 2019-05-25 DIAGNOSIS — F17210 Nicotine dependence, cigarettes, uncomplicated: Secondary | ICD-10-CM

## 2019-05-31 ENCOUNTER — Other Ambulatory Visit: Payer: Self-pay | Admitting: Family Medicine

## 2019-06-02 ENCOUNTER — Telehealth: Payer: Self-pay | Admitting: Physical Therapy

## 2019-06-02 NOTE — Telephone Encounter (Signed)
Dr. [name] is out of the office informed patient to call closer to appt time to request lab work.

## 2019-06-02 NOTE — Telephone Encounter (Signed)
Pt called to schedule a CPE (last one on 01/20/18).  First available slot wasn't until 06/23/19 at 1 pm.  Pt wondering if it would be possible to come in and do lab work earlier that week so that she doesn't have to be fasting until 1 pm on day of CPE.  Please call pt back to advise and schedule for appropriate lab visit.

## 2019-06-19 ENCOUNTER — Telehealth: Payer: Self-pay | Admitting: Family Medicine

## 2019-06-19 DIAGNOSIS — E785 Hyperlipidemia, unspecified: Secondary | ICD-10-CM

## 2019-06-19 DIAGNOSIS — F17200 Nicotine dependence, unspecified, uncomplicated: Secondary | ICD-10-CM

## 2019-06-19 DIAGNOSIS — R5383 Other fatigue: Secondary | ICD-10-CM

## 2019-06-19 DIAGNOSIS — E039 Hypothyroidism, unspecified: Secondary | ICD-10-CM

## 2019-06-19 DIAGNOSIS — I2510 Atherosclerotic heart disease of native coronary artery without angina pectoris: Secondary | ICD-10-CM

## 2019-06-19 NOTE — Telephone Encounter (Signed)
Please place orders so the front office can call to schedule a lab appt for that morning.    Copied from [place] [id number]. Topic: Appointment Scheduling - Scheduling Inquiry for Clinic >> Jun 19, 2019  9:45 AM Yvette Rack wrote: Reason for CRM: Pt stated she was told to call to schedule an appt for blood work prior to her appt on 06/23/19 at a pm. Pt stated she would like to come that morning. Pt requests orders and an appt for blood work

## 2019-06-19 NOTE — Telephone Encounter (Signed)
Pt wants to be scheduled for lab work before 06/23/2019. Labs are not placed.   Which labs would you like me to place for pt?   Please advise iuf anything is needed other than   CBC Bmp  Lipid B12 TSH

## 2019-06-19 NOTE — Telephone Encounter (Signed)
[place] with all of those labs.  [name]. [name], MD 06/19/2019 11:45 AM

## 2019-06-19 NOTE — Addendum Note (Signed)
Addended by: Gwenyth Ober R on: 06/19/2019 02:45 PM   Modules accepted: Orders

## 2019-06-19 NOTE — Telephone Encounter (Signed)
Labs have been placed. Left detailed message for pt to return phone call to schedule lab appointment.

## 2019-06-19 NOTE — Telephone Encounter (Signed)
I left a message asking the patient to call me at [phone] to schedule AWV with Loma Sousa (possibly after 8/21 CPE). VDM (Dee-Dee)

## 2019-06-19 NOTE — Telephone Encounter (Signed)
Noted  

## 2019-06-21 ENCOUNTER — Encounter

## 2019-06-23 ENCOUNTER — Encounter: Payer: Self-pay | Admitting: Family Medicine

## 2019-06-23 ENCOUNTER — Other Ambulatory Visit (INDEPENDENT_AMBULATORY_CARE_PROVIDER_SITE_OTHER): Payer: PPO

## 2019-06-23 ENCOUNTER — Ambulatory Visit (INDEPENDENT_AMBULATORY_CARE_PROVIDER_SITE_OTHER): Payer: PPO | Admitting: Family Medicine

## 2019-06-23 ENCOUNTER — Other Ambulatory Visit: Payer: Self-pay

## 2019-06-23 VITALS — BP 124/74 | HR 61 | Temp 98.0°F | Ht 64.5 in | Wt 160.4 lb

## 2019-06-23 DIAGNOSIS — F17210 Nicotine dependence, cigarettes, uncomplicated: Secondary | ICD-10-CM

## 2019-06-23 DIAGNOSIS — F17200 Nicotine dependence, unspecified, uncomplicated: Secondary | ICD-10-CM

## 2019-06-23 DIAGNOSIS — E785 Hyperlipidemia, unspecified: Secondary | ICD-10-CM

## 2019-06-23 DIAGNOSIS — I2510 Atherosclerotic heart disease of native coronary artery without angina pectoris: Secondary | ICD-10-CM | POA: Diagnosis not present

## 2019-06-23 DIAGNOSIS — E039 Hypothyroidism, unspecified: Secondary | ICD-10-CM | POA: Diagnosis not present

## 2019-06-23 DIAGNOSIS — I25119 Atherosclerotic heart disease of native coronary artery with unspecified angina pectoris: Secondary | ICD-10-CM | POA: Diagnosis not present

## 2019-06-23 DIAGNOSIS — Z0001 Encounter for general adult medical examination with abnormal findings: Secondary | ICD-10-CM

## 2019-06-23 DIAGNOSIS — R5383 Other fatigue: Secondary | ICD-10-CM | POA: Diagnosis not present

## 2019-06-23 DIAGNOSIS — Z23 Encounter for immunization: Secondary | ICD-10-CM | POA: Diagnosis not present

## 2019-06-23 LAB — CBC
HCT: 40.8 % (ref 36.0–46.0)
Hemoglobin: 13.7 g/dL (ref 12.0–15.0)
MCHC: 33.6 g/dL (ref 30.0–36.0)
MCV: 90.6 fl (ref 78.0–100.0)
Platelets: 214 10*3/uL (ref 150.0–400.0)
RBC: 4.5 Mil/uL (ref 3.87–5.11)
RDW: 14 % (ref 11.5–15.5)
WBC: 6.3 10*3/uL (ref 4.0–10.5)

## 2019-06-23 LAB — BASIC METABOLIC PANEL
BUN: 15 mg/dL (ref 6–23)
CO2: 27 mEq/L (ref 19–32)
Calcium: 9.2 mg/dL (ref 8.4–10.5)
Chloride: 105 mEq/L (ref 96–112)
Creatinine, Ser: 0.85 mg/dL (ref 0.40–1.20)
GFR: 65.49 mL/min (ref >60.00)
Glucose, Bld: 97 mg/dL (ref 70–99)
Potassium: 4.4 mEq/L (ref 3.5–5.1)
Sodium: 141 mEq/L (ref 135–145)

## 2019-06-23 LAB — LIPID PANEL
Cholesterol: 155 mg/dL (ref 0–200)
HDL: 57.2 mg/dL (ref >39.00)
LDL Cholesterol: 87 mg/dL (ref 0–99)
NonHDL: 98.13
Total CHOL/HDL Ratio: 3
Triglycerides: 55 mg/dL (ref 0.0–149.0)
VLDL: 11 mg/dL (ref 0.0–40.0)

## 2019-06-23 LAB — TSH: TSH: 3.21 u[IU]/mL (ref 0.35–4.50)

## 2019-06-23 LAB — VITAMIN B12: Vitamin B-12: 246 pg/mL (ref 211–911)

## 2019-06-23 MED ORDER — ATORVASTATIN CALCIUM 40 MG PO TABS: 40.0000 mg | ORAL_TABLET | Freq: Every day | ORAL | 3 refills | Status: DC

## 2019-06-23 MED ORDER — SYNTHROID 100 MCG PO TABS: 100.0000 ug | ORAL_TABLET | Freq: Every day | ORAL | 3 refills | Status: DC

## 2019-06-23 NOTE — Assessment & Plan Note (Signed)
LDL 87.  Continue Lipitor 40 mg daily.

## 2019-06-23 NOTE — Addendum Note (Signed)
Addended by: Loralyn Freshwater on: 06/23/2019 02:13 PM   Modules accepted: Orders

## 2019-06-23 NOTE — Assessment & Plan Note (Signed)
-  Continue Lipitor 40 mg daily ?

## 2019-06-23 NOTE — Progress Notes (Addendum)
Chief Complaint:  [name] is a 73 y.o. female who presents today for her annual comprehensive physical exam and subsequent medicare annual wellness visit.    Assessment/Plan:  Hyperlipidemia with target LDL less than 100 LDL 87.  Continue Lipitor 40 mg daily.  Coronary artery calcification seen on CAT scan Continue Lipitor 40 mg daily.  Cigarette smoker Patient was asked about her tobacco use today and was strongly advised to quit. Patient is currently precontemplative. We reviewed treatment options to assist her quit smoking including NRT, Chantix, and Bupropion. Follow up at next office visit.   Total time spent counseling approximately 3 minutes.    Hypothyroidism TSH within goal.  Continue Synthroid 100 mcg daily.   Body mass index is 27.1 kg/m. / Overweight    Preventative Healthcare: Flu vaccine given today.  She wants to have colon cancer screening done next year.  Does not want any further colonoscopies-may need Cologuard.  Up-to-date on mammogram.  No longer needs colon cancer screening.  Patient Counseling(The following topics were reviewed and/or handout was given):  -Nutrition: Stressed importance of moderation in sodium/caffeine intake, saturated fat and cholesterol, caloric balance, sufficient intake of fresh fruits, vegetables, and fiber.  -Stressed the importance of regular exercise.   -Substance Abuse: Discussed cessation/primary prevention of tobacco, alcohol, or other drug use; driving or other dangerous activities under the influence; availability of treatment for abuse.   -Injury prevention: Discussed safety belts, safety helmets, smoke detector, smoking near bedding or upholstery.   -Sexuality: Discussed sexually transmitted diseases, partner selection, use of condoms, avoidance of unintended pregnancy and contraceptive alternatives.   -Dental health: Discussed importance of regular tooth brushing, flossing, and dental visits.  -Health maintenance and  immunizations reviewed. Please refer to Health maintenance section.  During the course of the visit the patient was educated and counseled about appropriate screening and preventive services including:        Fall prevention   Nutrition Physical Activity Weight Management Cognition  Return to care in 1 year for next preventative visit.     Subjective:  HPI:  She has had some issues with food getting stuck in her throat.  Has been off about a year and a half. Stable over that time.  She is concerned about possible recurrence of enlarged thyroid.  No voice changes.  Occasionally feels like let us get stuck in her throat.  Health Risk Assessment: Patient considers her overall health to be good. He has no difficulty performing the following: . Preparing food and eating . Bathing  . Getting dressed . Using the toilet . Shopping . Managing Finances . Moving around from place to place  She has not had any falls within the past year.   Depression screen PHQ 2/9 06/23/2019  Decreased Interest 0  Down, Depressed, Hopeless 0  PHQ - 2 Score 0   Lifestyle Factors: Diet: No specific diets. Exercise: Likes to go golfing.   Patient Care Team: Vivi Barrack, MD as PCP - General (Family Medicine) Leonie Man, MD as PCP - Cardiology (Cardiology)   Her chronic medical conditions are outlined below:  # Hypothyroidism - On synthroid 149mcg daily and tolerating well  # Dyslipidemia / CAD - Follows with cardiology - On lipitor 40mg  daily - ROS: No reported chest pain or shortness of breath  ROS: Per HPI, otherwise a complete review of systems was negative.   PMH:  The following were reviewed and entered/updated in epic: Past Medical History:  Diagnosis Date  .  DIVERTICULITIS, HX OF 11/13/2010  . HYPOTHYROIDISM 11/13/2010  . MIGRAINE HEADACHE 11/13/2010  . SKIN CANCER, HX OF 11/13/2010  . TOBACCO ABUSE 11/13/2010   Patient Active Problem List   Diagnosis Date Noted  .  Coronary artery disease involving native coronary artery of native heart with angina pectoris ([place]) 06/23/2019  . Precordial pain 05/25/2018  . Hyperlipidemia with target LDL less than 100 05/25/2018  . Coronary artery calcification seen on CAT scan 04/20/2018  . Hypothyroidism 11/13/2010  . Cigarette smoker 11/13/2010  . MIGRAINE HEADACHE 11/13/2010  . SKIN CANCER, HX OF 11/13/2010  . DIVERTICULITIS, HX OF 11/13/2010   Past Surgical History:  Procedure Laterality Date  . NM MYOVIEW LTD  05/2018    EF~55%. Normal BP response.  No EKG change.  LOW RISK. No Ischemia or Infarction.  (Mild Breast Attenuation)  . THYROIDECTOMY  2004    Family History  Problem Relation Age of Onset  . Cancer Maternal Aunt        breast  . Cancer Paternal Aunt        breast  . Cancer Maternal Grandmother        breast  . Cancer Paternal Grandmother        breast  . Cancer Mother 32       Kidney  . Atrial fibrillation Father   . Heart attack Father 5    Medications- reviewed and updated Current Outpatient Medications  Medication Sig Dispense Refill  . atorvastatin (LIPITOR) 40 MG tablet Take 1 tablet (40 mg total) by mouth daily. 90 tablet 3  . SYNTHROID 100 MCG tablet Take 1 tablet (100 mcg total) by mouth daily before breakfast. 90 tablet 3   No current facility-administered medications for this visit.     Allergies-reviewed and updated Allergies  Allergen Reactions  . Codeine Sulfate     REACTION: gi upset    Social History   Socioeconomic History  . Marital status: Single    Spouse name: Not on file  . Number of children: Not on file  . Years of education: Not on file  . Highest education level: Not on file  Occupational History  . Not on file  Social Needs  . Financial resource strain: Not on file  . Food insecurity    Worry: Not on file    Inability: Not on file  . Transportation needs    Medical: Not on file    Non-medical: Not on file  Tobacco Use  . Smoking  status: Current Every Day Smoker    Packs/day: 1.00    Years: 35.00    Pack years: 35.00    Types: Cigarettes  . Smokeless tobacco: Never Used  Substance and Sexual Activity  . Alcohol use: Not on file  . Drug use: Not on file  . Sexual activity: Not on file  Lifestyle  . Physical activity    Days per week: Not on file    Minutes per session: Not on file  . Stress: Not on file  Relationships  . Social Herbalist on phone: Not on file    Gets together: Not on file    Attends religious service: Not on file    Active member of club or organization: Not on file    Attends meetings of clubs or organizations: Not on file    Relationship status: Not on file  Other Topics Concern  . Not on file  Social History Narrative  . Not on file  Objective:  Physical Exam: BP 124/74   Pulse 61   Temp 98 F (36.7 C)   Ht 5' 4.5" (1.638 m)   Wt 160 lb 6.1 oz (72.7 kg)   SpO2 95%   BMI 27.10 kg/m   Body mass index is 27.1 kg/m. Wt Readings from Last 3 Encounters:  06/23/19 160 lb 6.1 oz (72.7 kg)  04/17/19 162 lb 4 oz (73.6 kg)  08/08/18 164 lb (74.4 kg)   Gen: NAD, resting comfortably HEENT: TMs normal bilaterally. OP clear. No thyromegaly noted.  CV: RRR with no murmurs appreciated Pulm: NWOB, CTAB with no crackles, wheezes, or rhonchi GI: Normal bowel sounds present. Soft, Nontender, Nondistended. MSK: no edema, cyanosis, or clubbing noted Skin: warm, dry Neuro: CN2-12 grossly intact. Strength 5/5 in upper and lower extremities. Reflexes symmetric and intact bilaterally. No apparent cognitive impairment.  Psych: Normal affect and thought content     Tosh Glaze M. [name], MD 07/03/2019 7:50 AM

## 2019-06-23 NOTE — Assessment & Plan Note (Signed)
TSH within goal.  Continue Synthroid 100 mcg daily.

## 2019-06-23 NOTE — Patient Instructions (Signed)
It was very nice to see you today!  Your labs look great!  Let me know if your swallowing changes.  We will give you your flu vaccine today.  Come back to see me in 1 year for your next check up, or sooner if needed.   Take care, Dr [name]  Please try these tips to maintain a healthy lifestyle:   Eat at least 3 REAL meals and 1-2 snacks per day.  Aim for no more than 5 hours between eating.  If you eat breakfast, please do so within one hour of getting up.    Obtain twice as many fruits/vegetables as protein or carbohydrate foods for both lunch and dinner. (Half of each meal should be fruits/vegetables, one quarter protein, and one quarter starchy carbs)   Cut down on sweet beverages. This includes juice, soda, and sweet tea.    Exercise at least 150 minutes every week.    Preventive Care 73 Years and Older, Female Preventive care refers to lifestyle choices and visits with your health care provider that can promote health and wellness. This includes:  A yearly physical exam. This is also called an annual well check.  Regular dental and eye exams.  Immunizations.  Screening for certain conditions.  Healthy lifestyle choices, such as diet and exercise. What can I expect for my preventive care visit? Physical exam Your health care provider will check:  Height and weight. These may be used to calculate body mass index (BMI), which is a measurement that tells if you are at a healthy weight.  Heart rate and blood pressure.  Your skin for abnormal spots. Counseling Your health care provider may ask you questions about:  Alcohol, tobacco, and drug use.  Emotional well-being.  Home and relationship well-being.  Sexual activity.  Eating habits.  History of falls.  Memory and ability to understand (cognition).  Work and work Statistician.  Pregnancy and menstrual history. What immunizations do I need?  Influenza (flu) vaccine  This is recommended every  year. Tetanus, diphtheria, and pertussis (Tdap) vaccine  You may need a Td booster every 10 years. Varicella (chickenpox) vaccine  You may need this vaccine if you have not already been vaccinated. Zoster (shingles) vaccine  You may need this after age 22. Pneumococcal conjugate (PCV13) vaccine  One dose is recommended after age 73. Pneumococcal polysaccharide (PPSV23) vaccine  One dose is recommended after age 73. Measles, mumps, and rubella (MMR) vaccine  You may need at least one dose of MMR if you were born in 1957 or later. You may also need a second dose. Meningococcal conjugate (MenACWY) vaccine  You may need this if you have certain conditions. Hepatitis A vaccine  You may need this if you have certain conditions or if you travel or work in places where you may be exposed to hepatitis A. Hepatitis B vaccine  You may need this if you have certain conditions or if you travel or work in places where you may be exposed to hepatitis B. Haemophilus influenzae type b (Hib) vaccine  You may need this if you have certain conditions. You may receive vaccines as individual doses or as more than one vaccine together in one shot (combination vaccines). Talk with your health care provider about the risks and benefits of combination vaccines. What tests do I need? Blood tests  Lipid and cholesterol levels. These may be checked every 5 years, or more frequently depending on your overall health.  Hepatitis C test.  Hepatitis B  test. Screening  Lung cancer screening. You may have this screening every year starting at age 37 if you have a 30-pack-year history of smoking and currently smoke or have quit within the past 15 years.  Colorectal cancer screening. All adults should have this screening starting at age 21 and continuing until age 85. Your health care provider may recommend screening at age 15 if you are at increased risk. You will have tests every 1-10 years, depending on  your results and the type of screening test.  Diabetes screening. This is done by checking your blood sugar (glucose) after you have not eaten for a while (fasting). You may have this done every 1-3 years.  Mammogram. This may be done every 1-2 years. Talk with your health care provider about how often you should have regular mammograms.  BRCA-related cancer screening. This may be done if you have a family history of breast, ovarian, tubal, or peritoneal cancers. Other tests  Sexually transmitted disease (STD) testing.  Bone density scan. This is done to screen for osteoporosis. You may have this done starting at age 73. Follow these instructions at home: Eating and drinking  Eat a diet that includes fresh fruits and vegetables, whole grains, lean protein, and low-fat dairy products. Limit your intake of foods with high amounts of sugar, saturated fats, and salt.  Take vitamin and mineral supplements as recommended by your health care provider.  Do not drink alcohol if your health care provider tells you not to drink.  If you drink alcohol: ? Limit how much you have to 0-1 drink a day. ? Be aware of how much alcohol is in your drink. In the [place] one drink equals one 12 oz bottle of beer (355 mL), one 5 oz glass of wine (148 mL), or one 1 oz glass of hard liquor (44 mL). Lifestyle  Take daily care of your teeth and gums.  Stay active. Exercise for at least 30 minutes on 5 or more days each week.  Do not use any products that contain nicotine or tobacco, such as cigarettes, e-cigarettes, and chewing tobacco. If you need help quitting, ask your health care provider.  If you are sexually active, practice safe sex. Use a condom or other form of protection in order to prevent STIs (sexually transmitted infections).  Talk with your health care provider about taking a low-dose aspirin or statin. What's next?  Go to your health care provider once a year for a well check visit.  Ask  your health care provider how often you should have your eyes and teeth checked.  Stay up to date on all vaccines. This information is not intended to replace advice given to you by your health care provider. Make sure you discuss any questions you have with your health care provider. Document Released: 11/15/2015 Document Revised: 10/13/2018 Document Reviewed: 10/13/2018 Elsevier Patient Education  2020 [employer].

## 2019-06-23 NOTE — Assessment & Plan Note (Signed)
Patient was asked about her tobacco use today and was strongly advised to quit. Patient is currently precontemplative. We reviewed treatment options to assist her quit smoking including NRT, Chantix, and Bupropion. Follow up at next office visit.   Total time spent counseling approximately 3 minutes.   

## 2019-06-26 ENCOUNTER — Encounter: Payer: Self-pay | Admitting: Family Medicine

## 2019-06-26 DIAGNOSIS — L570 Actinic keratosis: Secondary | ICD-10-CM | POA: Diagnosis not present

## 2019-06-26 DIAGNOSIS — C44729 Squamous cell carcinoma of skin of left lower limb, including hip: Secondary | ICD-10-CM | POA: Diagnosis not present

## 2019-06-26 DIAGNOSIS — C44722 Squamous cell carcinoma of skin of right lower limb, including hip: Secondary | ICD-10-CM | POA: Diagnosis not present

## 2019-06-26 DIAGNOSIS — C44529 Squamous cell carcinoma of skin of other part of trunk: Secondary | ICD-10-CM | POA: Diagnosis not present

## 2019-06-26 DIAGNOSIS — Z85828 Personal history of other malignant neoplasm of skin: Secondary | ICD-10-CM | POA: Diagnosis not present

## 2019-06-26 DIAGNOSIS — L820 Inflamed seborrheic keratosis: Secondary | ICD-10-CM | POA: Diagnosis not present

## 2019-06-26 DIAGNOSIS — D485 Neoplasm of uncertain behavior of skin: Secondary | ICD-10-CM | POA: Diagnosis not present

## 2019-07-04 DIAGNOSIS — Z85828 Personal history of other malignant neoplasm of skin: Secondary | ICD-10-CM | POA: Diagnosis not present

## 2019-07-04 DIAGNOSIS — C44729 Squamous cell carcinoma of skin of left lower limb, including hip: Secondary | ICD-10-CM | POA: Diagnosis not present

## 2019-07-14 ENCOUNTER — Ambulatory Visit (INDEPENDENT_AMBULATORY_CARE_PROVIDER_SITE_OTHER)
Admission: RE | Admit: 2019-07-14 | Discharge: 2019-07-14 | Disposition: A | Discharge status: Home / Self Care | Payer: PPO | Source: Ambulatory Visit | Attending: Acute Care | Admitting: Acute Care

## 2019-07-14 ENCOUNTER — Other Ambulatory Visit: Payer: Self-pay

## 2019-07-14 DIAGNOSIS — Z122 Encounter for screening for malignant neoplasm of respiratory organs: Secondary | ICD-10-CM

## 2019-07-14 DIAGNOSIS — F17210 Nicotine dependence, cigarettes, uncomplicated: Secondary | ICD-10-CM

## 2019-07-14 DIAGNOSIS — Z87891 Personal history of nicotine dependence: Secondary | ICD-10-CM

## 2019-07-21 ENCOUNTER — Other Ambulatory Visit: Payer: Self-pay | Admitting: *Deleted

## 2019-07-21 DIAGNOSIS — Z122 Encounter for screening for malignant neoplasm of respiratory organs: Secondary | ICD-10-CM

## 2019-07-21 DIAGNOSIS — Z87891 Personal history of nicotine dependence: Secondary | ICD-10-CM

## 2019-07-21 DIAGNOSIS — F17210 Nicotine dependence, cigarettes, uncomplicated: Secondary | ICD-10-CM

## 2019-08-01 DIAGNOSIS — Z85828 Personal history of other malignant neoplasm of skin: Secondary | ICD-10-CM | POA: Diagnosis not present

## 2019-08-01 DIAGNOSIS — C44529 Squamous cell carcinoma of skin of other part of trunk: Secondary | ICD-10-CM | POA: Diagnosis not present

## 2019-08-14 DIAGNOSIS — Z1231 Encounter for screening mammogram for malignant neoplasm of breast: Secondary | ICD-10-CM | POA: Diagnosis not present

## 2019-08-14 DIAGNOSIS — Z803 Family history of malignant neoplasm of breast: Secondary | ICD-10-CM | POA: Diagnosis not present

## 2019-08-14 LAB — HM MAMMOGRAPHY

## 2019-08-30 ENCOUNTER — Encounter: Payer: Self-pay | Admitting: Family Medicine

## 2019-11-09 DIAGNOSIS — C44722 Squamous cell carcinoma of skin of right lower limb, including hip: Secondary | ICD-10-CM | POA: Diagnosis not present

## 2019-11-09 DIAGNOSIS — L570 Actinic keratosis: Secondary | ICD-10-CM | POA: Diagnosis not present

## 2019-11-09 DIAGNOSIS — Z85828 Personal history of other malignant neoplasm of skin: Secondary | ICD-10-CM | POA: Diagnosis not present

## 2019-11-09 DIAGNOSIS — D485 Neoplasm of uncertain behavior of skin: Secondary | ICD-10-CM | POA: Diagnosis not present

## 2019-11-25 DIAGNOSIS — H11121 Conjunctival concretions, right eye: Secondary | ICD-10-CM | POA: Diagnosis not present

## 2019-12-01 ENCOUNTER — Ambulatory Visit: Payer: PPO

## 2019-12-04 DIAGNOSIS — D3131 Benign neoplasm of right choroid: Secondary | ICD-10-CM | POA: Diagnosis not present

## 2019-12-07 ENCOUNTER — Ambulatory Visit: Payer: PPO | Attending: Internal Medicine

## 2019-12-07 DIAGNOSIS — Z23 Encounter for immunization: Secondary | ICD-10-CM | POA: Insufficient documentation

## 2019-12-07 NOTE — Progress Notes (Signed)
   Covid-19 Vaccination Clinic  Name:  [name]    MRN: [id number] DOB: [birth year]  12/07/2019  [name] was observed post Covid-19 immunization for 15 minutes without incidence. She was provided with Vaccine Information Sheet and instruction to access the V-Safe system.   [name] was instructed to call 911 with any severe reactions post vaccine: Marland Kitchen Difficulty breathing  . Swelling of your face and throat  . A fast heartbeat  . A bad rash all over your body  . Dizziness and weakness    Immunizations Administered    Name Date Dose VIS Date Route   Pfizer COVID-19 Vaccine 12/07/2019 10:14 AM 0.3 mL 10/13/2019 Intramuscular   Manufacturer: [place]   Lot: [id number]   [place]: [id number]

## 2019-12-12 ENCOUNTER — Ambulatory Visit: Payer: PPO

## 2020-01-01 ENCOUNTER — Other Ambulatory Visit: Payer: Self-pay

## 2020-01-01 ENCOUNTER — Ambulatory Visit: Payer: PPO | Attending: Internal Medicine

## 2020-01-01 DIAGNOSIS — Z23 Encounter for immunization: Secondary | ICD-10-CM | POA: Insufficient documentation

## 2020-01-01 NOTE — Progress Notes (Signed)
   Covid-19 Vaccination Clinic  Name:  [name]    MRN: [id number] DOB: [birth year]  01/01/2020  [name] was observed post Covid-19 immunization for 15 minutes without incidence. She was provided with Vaccine Information Sheet and instruction to access the V-Safe system.   [name] was instructed to call 911 with any severe reactions post vaccine: Marland Kitchen Difficulty breathing  . Swelling of your face and throat  . A fast heartbeat  . A bad rash all over your body  . Dizziness and weakness    Immunizations Administered    Name Date Dose VIS Date Route   Pfizer COVID-19 Vaccine 01/01/2020  9:24 AM 0.3 mL 10/13/2019 Intramuscular   Manufacturer: [place]   Lot: [id number]   [place]: [id number]

## 2020-03-20 DIAGNOSIS — C44722 Squamous cell carcinoma of skin of right lower limb, including hip: Secondary | ICD-10-CM | POA: Diagnosis not present

## 2020-03-20 DIAGNOSIS — Z85828 Personal history of other malignant neoplasm of skin: Secondary | ICD-10-CM | POA: Diagnosis not present

## 2020-03-20 DIAGNOSIS — L570 Actinic keratosis: Secondary | ICD-10-CM | POA: Diagnosis not present

## 2020-03-20 DIAGNOSIS — L821 Other seborrheic keratosis: Secondary | ICD-10-CM | POA: Diagnosis not present

## 2020-03-20 DIAGNOSIS — D485 Neoplasm of uncertain behavior of skin: Secondary | ICD-10-CM | POA: Diagnosis not present

## 2020-04-23 DIAGNOSIS — C44722 Squamous cell carcinoma of skin of right lower limb, including hip: Secondary | ICD-10-CM | POA: Diagnosis not present

## 2020-04-23 DIAGNOSIS — Z85828 Personal history of other malignant neoplasm of skin: Secondary | ICD-10-CM | POA: Diagnosis not present

## 2020-05-07 DIAGNOSIS — L570 Actinic keratosis: Secondary | ICD-10-CM | POA: Diagnosis not present

## 2020-07-09 DIAGNOSIS — D0471 Carcinoma in situ of skin of right lower limb, including hip: Secondary | ICD-10-CM | POA: Diagnosis not present

## 2020-07-09 DIAGNOSIS — L570 Actinic keratosis: Secondary | ICD-10-CM | POA: Diagnosis not present

## 2020-07-09 DIAGNOSIS — D485 Neoplasm of uncertain behavior of skin: Secondary | ICD-10-CM | POA: Diagnosis not present

## 2020-07-09 DIAGNOSIS — Z85828 Personal history of other malignant neoplasm of skin: Secondary | ICD-10-CM | POA: Diagnosis not present

## 2020-07-09 DIAGNOSIS — C44722 Squamous cell carcinoma of skin of right lower limb, including hip: Secondary | ICD-10-CM | POA: Diagnosis not present

## 2020-07-09 DIAGNOSIS — D0472 Carcinoma in situ of skin of left lower limb, including hip: Secondary | ICD-10-CM | POA: Diagnosis not present

## 2020-07-17 DIAGNOSIS — Z85828 Personal history of other malignant neoplasm of skin: Secondary | ICD-10-CM | POA: Diagnosis not present

## 2020-07-17 DIAGNOSIS — C44722 Squamous cell carcinoma of skin of right lower limb, including hip: Secondary | ICD-10-CM | POA: Diagnosis not present

## 2020-07-18 ENCOUNTER — Other Ambulatory Visit: Payer: Self-pay | Admitting: Family Medicine

## 2020-07-31 DIAGNOSIS — L570 Actinic keratosis: Secondary | ICD-10-CM | POA: Diagnosis not present

## 2020-08-05 ENCOUNTER — Other Ambulatory Visit: Payer: Self-pay

## 2020-08-05 ENCOUNTER — Ambulatory Visit (INDEPENDENT_AMBULATORY_CARE_PROVIDER_SITE_OTHER)
Admission: RE | Admit: 2020-08-05 | Discharge: 2020-08-05 | Disposition: A | Discharge status: Home / Self Care | Payer: PPO | Source: Ambulatory Visit | Attending: Acute Care | Admitting: Acute Care

## 2020-08-05 DIAGNOSIS — Z87891 Personal history of nicotine dependence: Secondary | ICD-10-CM | POA: Diagnosis not present

## 2020-08-05 DIAGNOSIS — F17210 Nicotine dependence, cigarettes, uncomplicated: Secondary | ICD-10-CM

## 2020-08-05 DIAGNOSIS — Z122 Encounter for screening for malignant neoplasm of respiratory organs: Secondary | ICD-10-CM

## 2020-08-08 ENCOUNTER — Telehealth: Payer: Self-pay | Admitting: Acute Care

## 2020-08-08 DIAGNOSIS — Z87891 Personal history of nicotine dependence: Secondary | ICD-10-CM

## 2020-08-08 DIAGNOSIS — F17210 Nicotine dependence, cigarettes, uncomplicated: Secondary | ICD-10-CM

## 2020-08-08 NOTE — Progress Notes (Signed)
Please call patient and let them  know their  low dose Ct was read as a Lung RADS 2: nodules that are benign in appearance and behavior with a very low likelihood of becoming a clinically active cancer due to size or lack of growth. Recommendation per radiology is for a repeat LDCT in 12 months. .Please let them  know we will order and schedule their  annual screening scan for 08/2021. Please let them  know there was notation of CAD on their  scan.  Please remind the patient  that this is a non-gated exam therefore degree or severity of disease  cannot be determined. Please have them  follow up with their PCP regarding potential risk factor modification, dietary therapy or pharmacologic therapy if clinically indicated. Pt.  is  currently on statin therapy. Please place order for annual  screening scan for  08/2021 and fax results to PCP. Thanks so much. 

## 2020-08-13 ENCOUNTER — Other Ambulatory Visit: Payer: Self-pay | Admitting: Family Medicine

## 2020-08-13 NOTE — Telephone Encounter (Signed)
[place] for refill? Last OV 06/23/2019

## 2020-08-13 NOTE — Telephone Encounter (Signed)
Please ask patient to schedule OV soon.  Algis Greenhouse. [name], MD 08/13/2020 12:16 PM

## 2020-08-13 NOTE — Telephone Encounter (Signed)
LMOM with  CT results per Eric Form, NP.    Copy sent to PCP.  Order placed for 1 yr f/u CT.

## 2020-08-13 NOTE — Telephone Encounter (Signed)
Please schedule pt for medication f/u visit soon.

## 2020-08-15 NOTE — Telephone Encounter (Signed)
LVM for patient to call back and scehdule appt with Dr. [name].

## 2020-08-19 DIAGNOSIS — Z1231 Encounter for screening mammogram for malignant neoplasm of breast: Secondary | ICD-10-CM | POA: Diagnosis not present

## 2020-08-19 LAB — HM MAMMOGRAPHY

## 2020-08-20 ENCOUNTER — Encounter: Payer: Self-pay | Admitting: Family Medicine

## 2020-09-09 ENCOUNTER — Other Ambulatory Visit: Payer: Self-pay | Admitting: Family Medicine

## 2020-09-12 DIAGNOSIS — C44722 Squamous cell carcinoma of skin of right lower limb, including hip: Secondary | ICD-10-CM | POA: Diagnosis not present

## 2020-09-12 DIAGNOSIS — Z85828 Personal history of other malignant neoplasm of skin: Secondary | ICD-10-CM | POA: Diagnosis not present

## 2020-09-15 ENCOUNTER — Other Ambulatory Visit: Payer: Self-pay | Admitting: Family Medicine

## 2020-10-04 ENCOUNTER — Other Ambulatory Visit: Payer: Self-pay | Admitting: Family Medicine

## 2020-10-16 ENCOUNTER — Other Ambulatory Visit: Payer: Self-pay | Admitting: Family Medicine

## 2020-10-30 DIAGNOSIS — L821 Other seborrheic keratosis: Secondary | ICD-10-CM | POA: Diagnosis not present

## 2020-10-30 DIAGNOSIS — Z85828 Personal history of other malignant neoplasm of skin: Secondary | ICD-10-CM | POA: Diagnosis not present

## 2020-10-30 DIAGNOSIS — L570 Actinic keratosis: Secondary | ICD-10-CM | POA: Diagnosis not present

## 2020-10-30 DIAGNOSIS — L814 Other melanin hyperpigmentation: Secondary | ICD-10-CM | POA: Diagnosis not present

## 2020-11-08 ENCOUNTER — Other Ambulatory Visit: Payer: Self-pay | Admitting: Family Medicine

## 2020-11-20 ENCOUNTER — Ambulatory Visit: Payer: PPO | Admitting: Family Medicine

## 2020-12-02 ENCOUNTER — Ambulatory Visit (INDEPENDENT_AMBULATORY_CARE_PROVIDER_SITE_OTHER): Payer: PPO | Admitting: Family Medicine

## 2020-12-02 ENCOUNTER — Encounter: Payer: Self-pay | Admitting: Family Medicine

## 2020-12-02 ENCOUNTER — Other Ambulatory Visit: Payer: Self-pay

## 2020-12-02 VITALS — BP 131/77 | HR 69 | Temp 98.1°F | Ht 64.5 in | Wt 163.8 lb

## 2020-12-02 DIAGNOSIS — R7303 Prediabetes: Secondary | ICD-10-CM | POA: Insufficient documentation

## 2020-12-02 DIAGNOSIS — E039 Hypothyroidism, unspecified: Secondary | ICD-10-CM

## 2020-12-02 DIAGNOSIS — R739 Hyperglycemia, unspecified: Secondary | ICD-10-CM | POA: Diagnosis not present

## 2020-12-02 DIAGNOSIS — E785 Hyperlipidemia, unspecified: Secondary | ICD-10-CM | POA: Diagnosis not present

## 2020-12-02 LAB — COMPREHENSIVE METABOLIC PANEL
ALT: 19 U/L (ref 0–35)
AST: 15 U/L (ref 0–37)
Albumin: 4.2 g/dL (ref 3.5–5.2)
Alkaline Phosphatase: 92 U/L (ref 39–117)
BUN: 16 mg/dL (ref 6–23)
CO2: 31 mEq/L (ref 19–32)
Calcium: 9.4 mg/dL (ref 8.4–10.5)
Chloride: 104 mEq/L (ref 96–112)
Creatinine, Ser: 0.84 mg/dL (ref 0.40–1.20)
GFR: 68.26 mL/min (ref >60.00)
Glucose, Bld: 92 mg/dL (ref 70–99)
Potassium: 4.8 mEq/L (ref 3.5–5.1)
Sodium: 139 mEq/L (ref 135–145)
Total Bilirubin: 0.5 mg/dL (ref 0.2–1.2)
Total Protein: 7.1 g/dL (ref 6.0–8.3)

## 2020-12-02 LAB — LIPID PANEL
Cholesterol: 172 mg/dL (ref 0–200)
HDL: 64.7 mg/dL (ref >39.00)
LDL Cholesterol: 96 mg/dL (ref 0–99)
NonHDL: 106.95
Total CHOL/HDL Ratio: 3
Triglycerides: 56 mg/dL (ref 0.0–149.0)
VLDL: 11.2 mg/dL (ref 0.0–40.0)

## 2020-12-02 LAB — CBC
HCT: 43.7 % (ref 36.0–46.0)
Hemoglobin: 14.5 g/dL (ref 12.0–15.0)
MCHC: 33.2 g/dL (ref 30.0–36.0)
MCV: 91.5 fl (ref 78.0–100.0)
Platelets: 267 10*3/uL (ref 150.0–400.0)
RBC: 4.77 Mil/uL (ref 3.87–5.11)
RDW: 14 % (ref 11.5–15.5)
WBC: 6.7 10*3/uL (ref 4.0–10.5)

## 2020-12-02 LAB — HEMOGLOBIN A1C: Hgb A1c MFr Bld: 6.3 % (ref 4.6–6.5)

## 2020-12-02 LAB — TSH: TSH: 5.04 u[IU]/mL — ABNORMAL HIGH (ref 0.35–4.50)

## 2020-12-02 MED ORDER — SYNTHROID 100 MCG PO TABS
ORAL_TABLET | ORAL | 3 refills | Status: DC
Start: 2020-12-02 — End: 2021-11-25

## 2020-12-02 MED ORDER — ATORVASTATIN CALCIUM 40 MG PO TABS
40.0000 mg | ORAL_TABLET | Freq: Every day | ORAL | 3 refills | Status: DC
Start: 2020-12-02 — End: 2021-11-17

## 2020-12-02 NOTE — Assessment & Plan Note (Signed)
Check A1c. 

## 2020-12-02 NOTE — Addendum Note (Signed)
Addended by: Vivi Barrack on: 12/02/2020 04:11 PM   Modules accepted: Orders

## 2020-12-02 NOTE — Progress Notes (Signed)
   [name] is a 75 y.o. female who presents today for an office visit.  Assessment/Plan:  Chronic Problems Addressed Today: Hyperglycemia Check A1c.  Hyperlipidemia with target LDL less than 100 Check lipids, CBC, CMET, TSH.  Continue Lipitor 40 mg daily.  Hypothyroidism She has been off Synthroid for the last week or so.  We will check TSH today.  She is previously on Synthroid 100 mcg daily.  We will send a new prescription once TSH results.  Preventative Healthcare Check labs. UTD on vaccines. Refused DEXA scan.     Subjective:  HPI:  See A/p/         Objective:  Physical Exam: BP 131/77   Pulse 69   Temp 98.1 F (36.7 C) (Temporal)   Ht 5' 4.5" (1.638 m)   Wt 163 lb 12.8 oz (74.3 kg)   SpO2 96%   BMI 27.68 kg/m   Gen: No acute distress, resting comfortably CV: Regular rate and rhythm with no murmurs appreciated Pulm: Normal work of breathing, clear to auscultation bilaterally with no crackles, wheezes, or rhonchi Neuro: Grossly normal, moves all extremities Psych: Normal affect and thought content      [name] M. [name], MD 12/02/2020 10:33 AM

## 2020-12-02 NOTE — Patient Instructions (Addendum)
It was very nice to see you today!  We will check blood work today.  We will send in your medications this afternoon once I get the results back.  Please continue work on diet and exercise.  Please make sure that you are getting plenty of calcium and vitamin D in your diet.  I will see you back in a year for your annual checkup with labs.  Please come back to see me sooner if needed.  She is getting varicella  Take care, Dr [name]  Please try these tips to maintain a healthy lifestyle:   Eat at least 3 REAL meals and 1-2 snacks per day.  Aim for no more than 5 hours between eating.  If you eat breakfast, please do so within one hour of getting up.    Each meal should contain half fruits/vegetables, one quarter protein, and one quarter carbs (no bigger than a computer mouse)   Cut down on sweet beverages. This includes juice, soda, and sweet tea.     Drink at least 1 glass of water with each meal and aim for at least 8 glasses per day   Exercise at least 150 minutes every week.

## 2020-12-02 NOTE — Assessment & Plan Note (Signed)
>>  ASSESSMENT AND PLAN FOR HYPERGLYCEMIA WRITTEN ON 12/02/2020 10:32 AM BY Ardith Dark, MD  Check A1c.

## 2020-12-02 NOTE — Progress Notes (Signed)
Please inform patient of the following:  Thyroid is up a bit but this is because she has been off meds for a week or so.  I will resend in her previous dose.  We should recheck again in about 4 to 6 weeks.  Her blood sugar number is borderline.  Everything else is stable.  Do not need to start medications.  She should continue working on diet and exercise and we can recheck in year or so.

## 2020-12-02 NOTE — Assessment & Plan Note (Signed)
Check lipids, CBC, CMET, TSH.  Continue Lipitor 40 mg daily. 

## 2020-12-02 NOTE — Assessment & Plan Note (Signed)
She has been off Synthroid for the last week or so.  We will check TSH today.  She is previously on Synthroid 100 mcg daily.  We will send a new prescription once TSH results.

## 2020-12-11 ENCOUNTER — Telehealth: Payer: Self-pay

## 2020-12-11 DIAGNOSIS — C44729 Squamous cell carcinoma of skin of left lower limb, including hip: Secondary | ICD-10-CM | POA: Diagnosis not present

## 2020-12-11 DIAGNOSIS — C44722 Squamous cell carcinoma of skin of right lower limb, including hip: Secondary | ICD-10-CM | POA: Diagnosis not present

## 2020-12-11 DIAGNOSIS — Z85828 Personal history of other malignant neoplasm of skin: Secondary | ICD-10-CM | POA: Diagnosis not present

## 2020-12-11 NOTE — Telephone Encounter (Signed)
I am not PCP  Why does PCP need to review? [place] can't you address this and fax to that office? I do not see a medical question that Dr. [name] needs to address

## 2020-12-11 NOTE — Telephone Encounter (Signed)
See below

## 2020-12-11 NOTE — Telephone Encounter (Signed)
Patient called in asking if we would be able to send the lab work from last appointment to Dr.Dan Ronnald Ramp, Dermatologist. Fax number is [phone].

## 2020-12-12 NOTE — Telephone Encounter (Signed)
Labs have been faxed.

## 2020-12-17 DIAGNOSIS — Z85828 Personal history of other malignant neoplasm of skin: Secondary | ICD-10-CM | POA: Diagnosis not present

## 2020-12-17 DIAGNOSIS — C44729 Squamous cell carcinoma of skin of left lower limb, including hip: Secondary | ICD-10-CM | POA: Diagnosis not present

## 2021-01-08 DIAGNOSIS — Z85828 Personal history of other malignant neoplasm of skin: Secondary | ICD-10-CM | POA: Diagnosis not present

## 2021-01-08 DIAGNOSIS — C44729 Squamous cell carcinoma of skin of left lower limb, including hip: Secondary | ICD-10-CM | POA: Diagnosis not present

## 2021-01-08 DIAGNOSIS — Z79899 Other long term (current) drug therapy: Secondary | ICD-10-CM | POA: Diagnosis not present

## 2021-01-08 DIAGNOSIS — C44722 Squamous cell carcinoma of skin of right lower limb, including hip: Secondary | ICD-10-CM | POA: Diagnosis not present

## 2021-02-10 DIAGNOSIS — Z85828 Personal history of other malignant neoplasm of skin: Secondary | ICD-10-CM | POA: Diagnosis not present

## 2021-02-10 DIAGNOSIS — C44729 Squamous cell carcinoma of skin of left lower limb, including hip: Secondary | ICD-10-CM | POA: Diagnosis not present

## 2021-04-09 DIAGNOSIS — D3131 Benign neoplasm of right choroid: Secondary | ICD-10-CM | POA: Diagnosis not present

## 2021-05-12 DIAGNOSIS — Z85828 Personal history of other malignant neoplasm of skin: Secondary | ICD-10-CM | POA: Diagnosis not present

## 2021-05-12 DIAGNOSIS — L570 Actinic keratosis: Secondary | ICD-10-CM | POA: Diagnosis not present

## 2021-05-12 DIAGNOSIS — C44729 Squamous cell carcinoma of skin of left lower limb, including hip: Secondary | ICD-10-CM | POA: Diagnosis not present

## 2021-05-21 DIAGNOSIS — H2511 Age-related nuclear cataract, right eye: Secondary | ICD-10-CM | POA: Diagnosis not present

## 2021-05-21 DIAGNOSIS — H2513 Age-related nuclear cataract, bilateral: Secondary | ICD-10-CM | POA: Diagnosis not present

## 2021-05-21 DIAGNOSIS — H25013 Cortical age-related cataract, bilateral: Secondary | ICD-10-CM | POA: Diagnosis not present

## 2021-05-21 DIAGNOSIS — H353131 Nonexudative age-related macular degeneration, bilateral, early dry stage: Secondary | ICD-10-CM | POA: Diagnosis not present

## 2021-05-21 DIAGNOSIS — H35363 Drusen (degenerative) of macula, bilateral: Secondary | ICD-10-CM | POA: Diagnosis not present

## 2021-05-21 DIAGNOSIS — H25043 Posterior subcapsular polar age-related cataract, bilateral: Secondary | ICD-10-CM | POA: Diagnosis not present

## 2021-06-03 ENCOUNTER — Encounter (INDEPENDENT_AMBULATORY_CARE_PROVIDER_SITE_OTHER): Payer: Self-pay | Admitting: Ophthalmology

## 2021-06-03 ENCOUNTER — Ambulatory Visit (INDEPENDENT_AMBULATORY_CARE_PROVIDER_SITE_OTHER): Payer: PPO | Admitting: Ophthalmology

## 2021-06-03 ENCOUNTER — Other Ambulatory Visit: Payer: Self-pay

## 2021-06-03 DIAGNOSIS — H25813 Combined forms of age-related cataract, bilateral: Secondary | ICD-10-CM | POA: Diagnosis not present

## 2021-06-03 DIAGNOSIS — H353132 Nonexudative age-related macular degeneration, bilateral, intermediate dry stage: Secondary | ICD-10-CM

## 2021-06-03 DIAGNOSIS — D3131 Benign neoplasm of right choroid: Secondary | ICD-10-CM | POA: Diagnosis not present

## 2021-06-03 DIAGNOSIS — H3581 Retinal edema: Secondary | ICD-10-CM

## 2021-06-03 NOTE — Progress Notes (Signed)
[facility] Note  06/03/2021     CHIEF COMPLAINT Patient presents for Retina Evaluation   HISTORY OF PRESENT ILLNESS: [name] is a 75 y.o. female who presents to the clinic today for:   HPI     Retina Evaluation   In both eyes.  This started years ago.  Duration of years.  Context:  distance vision, mid-range vision and near vision.  I, the attending physician,  performed the HPI with the patient and updated documentation appropriately.        Comments   75 y/o female pt referred by Dr. Lucita Ferrara for clearance for cat sx.  Pt really does not know why she is here.  Cat sx OU is already scheduled.  VA blurred OU.  Denies pain, FOL, but has a few small floaters OU.  C/o glare and halos, especially at night.  AT prn OU.      Last edited by Bernarda Caffey, MD on 06/04/2021 12:21 AM.    Pt is here on the referral of Dr. Lucita Ferrara for cataract clearance, pt states she is scheduled for sx on August 16 and 23, her regular eye dr is Dr. [place] who has been monitoring her macular degeneration for 3-4 years, pt states she is on AREDS 2, pt denies being diabetic or hypertensive, she takes Synthroid daily  Referring physician: Vevelyn Royals, MD No address on file  HISTORICAL INFORMATION:   Selected notes from the [facility] Referred by Dr. Lucita Ferrara for cat sx clearance   CURRENT MEDICATIONS: Current Outpatient Medications (Ophthalmic Drugs)  Medication Sig   BESIVANCE 0.6 % SUSP Place 1 drop into the right eye 3 (three) times daily. (Patient not taking: Reported on 06/03/2021)   DUREZOL 0.05 % EMUL Place 1 drop into the right eye 3 (three) times daily. (Patient not taking: Reported on 06/03/2021)   FLAREX 0.1 % ophthalmic suspension Apply to eye. (Patient not taking: Reported on 06/03/2021)   gatifloxacin (ZYMAXID) 0.5 % SOLN SMARTSIG:In Eye(s) (Patient not taking: Reported on 06/03/2021)   ketorolac (ACULAR) 0.5 % ophthalmic solution  SMARTSIG:In Eye(s) (Patient not taking: Reported on 06/03/2021)   PROLENSA 0.07 % SOLN Place 1 drop into the right eye 2 (two) times daily. (Patient not taking: Reported on 06/03/2021)   No current facility-administered medications for this visit. (Ophthalmic Drugs)   Current Outpatient Medications (Other)  Medication Sig   atorvastatin (LIPITOR) 40 MG tablet Take 1 tablet (40 mg total) by mouth daily.   SYNTHROID 100 MCG tablet TAKE 1 TABLET BY MOUTH EVERY DAY BEFORE BREAKFAST   No current facility-administered medications for this visit. (Other)   REVIEW OF SYSTEMS: ROS   Positive for: Gastrointestinal, Skin, Cardiovascular, Eyes Negative for: Constitutional, Neurological, Genitourinary, Musculoskeletal, HENT, Endocrine, Respiratory, Psychiatric, Allergic/Imm, Heme/Lymph Last edited by Matthew Folks, COA on 06/03/2021  1:27 PM.     ALLERGIES Allergies  Allergen Reactions   Codeine Sulfate     REACTION: gi upset    PAST MEDICAL HISTORY Past Medical History:  Diagnosis Date   Cataract    DIVERTICULITIS, HX OF 11/13/2010   HYPOTHYROIDISM 11/13/2010   MIGRAINE HEADACHE 11/13/2010   SKIN CANCER, HX OF 11/13/2010   TOBACCO ABUSE 11/13/2010   Past Surgical History:  Procedure Laterality Date   NM MYOVIEW LTD  05/2018    EF~55%. Normal BP response.  No EKG change.  LOW RISK. No Ischemia or Infarction.  (Mild Breast Attenuation)   THYROIDECTOMY  2004  FAMILY HISTORY Family History  Problem Relation Age of Onset   Cancer Maternal Aunt        breast   Cancer Paternal Aunt        breast   Cancer Maternal Grandmother        breast   Cancer Paternal Grandmother        breast   Cancer Mother 74       Kidney   Atrial fibrillation Father    Heart attack Father 93    SOCIAL HISTORY Social History   Tobacco Use   Smoking status: Every Day    Packs/day: 1.00    Years: 35.00    Pack years: 35.00    Types: Cigarettes   Smokeless tobacco: Never         OPHTHALMIC  EXAM:  Base Eye Exam     Visual Acuity (Snellen - Linear)       Right Left   Dist cc 20/40 20/40   Dist ph cc 20/30 -2 20/30 -2    Correction: Glasses         Tonometry (Tonopen, 1:28 PM)       Right Left   Pressure 13 16         Pupils       Dark Light Shape React APD   Right 3 2 Round Brisk None   Left 3 2 Round Brisk None         Visual Fields (Counting fingers)       Left Right    Full Full         Extraocular Movement       Right Left    Full, Ortho Full, Ortho         Neuro/Psych     Oriented x3: Yes   Mood/Affect: Normal         Dilation     Both eyes: 1.0% Mydriacyl, 2.5% Phenylephrine @ 1:40 PM           Slit Lamp and Fundus Exam     Slit Lamp Exam       Right Left   Lids/Lashes Dermatochalasis - upper lid Dermatochalasis - upper lid   Conjunctiva/Sclera Mild nasal pingeucula Mild nasal pingeucula   Cornea Clear Trace tear film debris   Anterior Chamber Deep and quiet Deep and quiet   Iris Round and dilated, No NVI Round and dilated, No NVI   Lens 2-3+ Nuclear sclerosis, 2-3+ Cortical cataract 2-3+ Nuclear sclerosis, 2-3+ Cortical cataract   Vitreous Vitreous syneresis Vitreous syneresis         Fundus Exam       Right Left   Disc Compact, Pink and Sharp, peripapillary choroidal nevus nasally (1x2.5DD), flat, no SRF Pink and Sharp, Compact, mild PPA   C/D Ratio 0.3 0.2   Macula Flat, Blunted foveal reflex, Drusen, PED, No heme or edema Flat, Blunted foveal reflex, Drusen, PED, No heme or edema   Vessels attenuated, Tortuous mild attenuation, mild tortuousity   Periphery Attached, No heme  Attached, No heme            Refraction     Wearing Rx       Sphere Cylinder Axis Add   Right +2.50 +0.75 005 +2.75   Left +2.50 +0.75 015 +2.75    Age: 73 yr   Type: PAL         Manifest Refraction       Sphere Cylinder Axis Dist VA   Right +  2.75 +1.00 010 20/30-2   Left +2.25 +1.25 018 20/30-2             IMAGING AND PROCEDURES  Imaging and Procedures for 06/03/2021  OCT, Retina - OU - Both Eyes       Right Eye Quality was good. Central Foveal Thickness: 331. Progression has no prior data. Findings include normal foveal contour, no IRF, no SRF, retinal drusen , epiretinal membrane, pigment epithelial detachment (Mild ERM temporal macula).   Left Eye Quality was good. Central Foveal Thickness: 321. Progression has no prior data. Findings include normal foveal contour, no IRF, no SRF, retinal drusen , pigment epithelial detachment (Partial PVD).   Notes *Images captured and stored on drive  Diagnosis / Impression:  non-exu ARMD w/ PED OU OD: mild focal ERM temporal macula  Clinical management:  See below  Abbreviations: NFP - Normal foveal profile. CME - cystoid macular edema. PED - pigment epithelial detachment. IRF - intraretinal fluid. SRF - subretinal fluid. EZ - ellipsoid zone. ERM - epiretinal membrane. ORA - outer retinal atrophy. ORT - outer retinal tubulation. SRHM - subretinal hyper-reflective material. IRHM - intraretinal hyper-reflective material            ASSESSMENT/PLAN:    ICD-10-CM   1. Intermediate stage nonexudative age-related macular degeneration of both eyes  H35.3132     2. Retinal edema  H35.81 OCT, Retina - OU - Both Eyes    3. Combined forms of age-related cataract of both eyes  H25.813     4. Nevus of choroid of right eye  D31.31       1,2. Age related macular degeneration, non-exudative, both eyes  - intermediate stage with PED OU  - no exudative disease  - The incidence, anatomy, and pathology of dry AMD, risk of progression, and the AREDS and AREDS 2 study including smoking risks discussed with patient.  - Recommend amsler grid monitoring  - pt of Dr. [place], who has been tracking nonex ARMD  - f/u here PRN  - pt is cleared from a retina standpoint for release to Dr. Lucita Ferrara for cataract surgery and to Dr. [place] for resumption of  primary eye care   3. Mixed Cataract OU - The symptoms of cataract, surgical options, and treatments and risks were discussed with patient. - discussed diagnosis and progression - under the expert management of Dr. Lucita Ferrara - surgeries scheduled for August 16 and August 23 - clear from a retina standpoint to proceed with cataract surgery when pt and surgeon are ready  4. Choroidal Nevus, OD  - peripapillary pigmented lesion nasally (~1x2.5DD) -- flat  - no visual symptoms, SRF or orange pigment  - thickness < 29m  - discussed findings, prognosis  - recommend continued monitoring w/ Dr. [place] - f/u here prn  Ophthalmic Meds Ordered this visit:  No orders of the defined types were placed in this encounter.     Return if symptoms worsen or fail to improve.  There are no Patient Instructions on file for this visit.  Explained the diagnoses, plan, and follow up with the patient and they expressed understanding.  Patient expressed understanding of the importance of proper follow up care.   This document serves as a record of services personally performed by BGardiner Sleeper MD, PhD. It was created on their behalf by AEstill Bakes COT an ophthalmic technician. The creation of this record is the provider's dictation and/or activities during the visit.    Electronically signed by: AMitzi Hansen  Baxley, COT 8.2.22 @ 12:27 AM   Gardiner Sleeper, M.D., Ph.D. Diseases & Surgery of the Retina and Vitreous [facility]  I have reviewed the above documentation for accuracy and completeness, and I agree with the above. Gardiner Sleeper, M.D., Ph.D. 06/04/21 12:27 AM   Abbreviations: M myopia (nearsighted); A astigmatism; H hyperopia (farsighted); P presbyopia; Mrx spectacle prescription;  CTL contact lenses; OD right eye; OS left eye; OU both eyes  XT exotropia; ET esotropia; PEK punctate epithelial keratitis; PEE punctate epithelial erosions; DES dry eye syndrome; MGD  meibomian gland dysfunction; ATs artificial tears; PFAT's preservative free artificial tears; [place] nuclear sclerotic cataract; PSC posterior subcapsular cataract; ERM epi-retinal membrane; PVD posterior vitreous detachment; RD retinal detachment; DM diabetes mellitus; DR diabetic retinopathy; NPDR non-proliferative diabetic retinopathy; PDR proliferative diabetic retinopathy; CSME clinically significant macular edema; DME diabetic macular edema; dbh dot blot hemorrhages; CWS cotton wool spot; POAG primary open angle glaucoma; C/D cup-to-disc ratio; HVF humphrey visual field; GVF goldmann visual field; OCT optical coherence tomography; IOP intraocular pressure; BRVO Branch retinal vein occlusion; CRVO central retinal vein occlusion; CRAO central retinal artery occlusion; BRAO branch retinal artery occlusion; RT retinal tear; SB scleral buckle; PPV pars plana vitrectomy; VH Vitreous hemorrhage; PRP panretinal laser photocoagulation; IVK intravitreal kenalog; VMT vitreomacular traction; MH Macular hole;  NVD neovascularization of the disc; NVE neovascularization elsewhere; AREDS age related eye disease study; ARMD age related macular degeneration; POAG primary open angle glaucoma; EBMD epithelial/anterior basement membrane dystrophy; ACIOL anterior chamber intraocular lens; IOL intraocular lens; PCIOL posterior chamber intraocular lens; Phaco/IOL phacoemulsification with intraocular lens placement; [place] photorefractive keratectomy; LASIK laser assisted in situ keratomileusis; HTN hypertension; DM diabetes mellitus; COPD chronic obstructive pulmonary disease

## 2021-06-04 ENCOUNTER — Encounter (INDEPENDENT_AMBULATORY_CARE_PROVIDER_SITE_OTHER): Payer: Self-pay | Admitting: Ophthalmology

## 2021-06-09 DIAGNOSIS — Z85828 Personal history of other malignant neoplasm of skin: Secondary | ICD-10-CM | POA: Diagnosis not present

## 2021-06-09 DIAGNOSIS — C44729 Squamous cell carcinoma of skin of left lower limb, including hip: Secondary | ICD-10-CM | POA: Diagnosis not present

## 2021-06-09 DIAGNOSIS — L570 Actinic keratosis: Secondary | ICD-10-CM | POA: Diagnosis not present

## 2021-06-17 DIAGNOSIS — H2511 Age-related nuclear cataract, right eye: Secondary | ICD-10-CM | POA: Diagnosis not present

## 2021-06-17 DIAGNOSIS — H25811 Combined forms of age-related cataract, right eye: Secondary | ICD-10-CM | POA: Diagnosis not present

## 2021-06-17 DIAGNOSIS — H2512 Age-related nuclear cataract, left eye: Secondary | ICD-10-CM | POA: Diagnosis not present

## 2021-06-24 DIAGNOSIS — H2512 Age-related nuclear cataract, left eye: Secondary | ICD-10-CM | POA: Diagnosis not present

## 2021-08-20 DIAGNOSIS — Z85828 Personal history of other malignant neoplasm of skin: Secondary | ICD-10-CM | POA: Diagnosis not present

## 2021-08-20 DIAGNOSIS — L814 Other melanin hyperpigmentation: Secondary | ICD-10-CM | POA: Diagnosis not present

## 2021-08-20 DIAGNOSIS — D0462 Carcinoma in situ of skin of left upper limb, including shoulder: Secondary | ICD-10-CM | POA: Diagnosis not present

## 2021-08-20 DIAGNOSIS — L570 Actinic keratosis: Secondary | ICD-10-CM | POA: Diagnosis not present

## 2021-08-20 DIAGNOSIS — C44629 Squamous cell carcinoma of skin of left upper limb, including shoulder: Secondary | ICD-10-CM | POA: Diagnosis not present

## 2021-08-20 DIAGNOSIS — D485 Neoplasm of uncertain behavior of skin: Secondary | ICD-10-CM | POA: Diagnosis not present

## 2021-09-05 DIAGNOSIS — Z1231 Encounter for screening mammogram for malignant neoplasm of breast: Secondary | ICD-10-CM | POA: Diagnosis not present

## 2021-09-05 LAB — HM MAMMOGRAPHY

## 2021-09-09 ENCOUNTER — Other Ambulatory Visit: Payer: Self-pay | Admitting: *Deleted

## 2021-09-09 DIAGNOSIS — Z87891 Personal history of nicotine dependence: Secondary | ICD-10-CM

## 2021-09-09 DIAGNOSIS — F17210 Nicotine dependence, cigarettes, uncomplicated: Secondary | ICD-10-CM

## 2021-09-17 ENCOUNTER — Encounter: Payer: Self-pay | Admitting: Family Medicine

## 2021-09-18 ENCOUNTER — Other Ambulatory Visit: Payer: Self-pay

## 2021-09-18 ENCOUNTER — Ambulatory Visit (INDEPENDENT_AMBULATORY_CARE_PROVIDER_SITE_OTHER)
Admission: RE | Admit: 2021-09-18 | Discharge: 2021-09-18 | Disposition: A | Discharge status: Home / Self Care | Payer: PPO | Source: Ambulatory Visit | Attending: Family Medicine | Admitting: Family Medicine

## 2021-09-18 DIAGNOSIS — Z87891 Personal history of nicotine dependence: Secondary | ICD-10-CM

## 2021-09-18 DIAGNOSIS — F17210 Nicotine dependence, cigarettes, uncomplicated: Secondary | ICD-10-CM | POA: Diagnosis not present

## 2021-09-22 ENCOUNTER — Other Ambulatory Visit: Payer: Self-pay | Admitting: Acute Care

## 2021-09-22 DIAGNOSIS — Z87891 Personal history of nicotine dependence: Secondary | ICD-10-CM

## 2021-09-22 DIAGNOSIS — F17210 Nicotine dependence, cigarettes, uncomplicated: Secondary | ICD-10-CM

## 2021-11-17 ENCOUNTER — Other Ambulatory Visit: Payer: Self-pay | Admitting: Family Medicine

## 2021-11-17 DIAGNOSIS — D485 Neoplasm of uncertain behavior of skin: Secondary | ICD-10-CM | POA: Diagnosis not present

## 2021-11-17 DIAGNOSIS — D0461 Carcinoma in situ of skin of right upper limb, including shoulder: Secondary | ICD-10-CM | POA: Diagnosis not present

## 2021-11-17 DIAGNOSIS — Z85828 Personal history of other malignant neoplasm of skin: Secondary | ICD-10-CM | POA: Diagnosis not present

## 2021-11-17 DIAGNOSIS — L570 Actinic keratosis: Secondary | ICD-10-CM | POA: Diagnosis not present

## 2021-11-25 ENCOUNTER — Other Ambulatory Visit: Payer: Self-pay | Admitting: Family Medicine

## 2021-11-26 DIAGNOSIS — M79671 Pain in right foot: Secondary | ICD-10-CM | POA: Diagnosis not present

## 2021-12-03 DIAGNOSIS — M79671 Pain in right foot: Secondary | ICD-10-CM | POA: Diagnosis not present

## 2021-12-10 ENCOUNTER — Other Ambulatory Visit: Payer: Self-pay | Admitting: Family Medicine

## 2021-12-20 ENCOUNTER — Other Ambulatory Visit: Payer: Self-pay | Admitting: Family Medicine

## 2021-12-29 ENCOUNTER — Encounter: Payer: Self-pay | Admitting: Family Medicine

## 2021-12-29 ENCOUNTER — Other Ambulatory Visit: Payer: Self-pay

## 2021-12-29 ENCOUNTER — Ambulatory Visit (INDEPENDENT_AMBULATORY_CARE_PROVIDER_SITE_OTHER): Payer: PPO | Admitting: Family Medicine

## 2021-12-29 VITALS — BP 138/84 | HR 70 | Temp 98.0°F | Ht 64.5 in | Wt 161.0 lb

## 2021-12-29 DIAGNOSIS — E785 Hyperlipidemia, unspecified: Secondary | ICD-10-CM

## 2021-12-29 DIAGNOSIS — I25119 Atherosclerotic heart disease of native coronary artery with unspecified angina pectoris: Secondary | ICD-10-CM | POA: Diagnosis not present

## 2021-12-29 DIAGNOSIS — R739 Hyperglycemia, unspecified: Secondary | ICD-10-CM

## 2021-12-29 DIAGNOSIS — F17210 Nicotine dependence, cigarettes, uncomplicated: Secondary | ICD-10-CM | POA: Diagnosis not present

## 2021-12-29 DIAGNOSIS — Z0001 Encounter for general adult medical examination with abnormal findings: Secondary | ICD-10-CM

## 2021-12-29 DIAGNOSIS — E039 Hypothyroidism, unspecified: Secondary | ICD-10-CM

## 2021-12-29 LAB — COMPREHENSIVE METABOLIC PANEL
ALT: 22 U/L (ref 0–35)
AST: 17 U/L (ref 0–37)
Albumin: 4.3 g/dL (ref 3.5–5.2)
Alkaline Phosphatase: 88 U/L (ref 39–117)
BUN: 14 mg/dL (ref 6–23)
CO2: 31 mEq/L (ref 19–32)
Calcium: 9.8 mg/dL (ref 8.4–10.5)
Chloride: 101 mEq/L (ref 96–112)
Creatinine, Ser: 0.8 mg/dL (ref 0.40–1.20)
GFR: 71.83 mL/min (ref >60.00)
Glucose, Bld: 89 mg/dL (ref 70–99)
Potassium: 4.1 mEq/L (ref 3.5–5.1)
Sodium: 137 mEq/L (ref 135–145)
Total Bilirubin: 0.5 mg/dL (ref 0.2–1.2)
Total Protein: 7.3 g/dL (ref 6.0–8.3)

## 2021-12-29 LAB — LIPID PANEL
Cholesterol: 154 mg/dL (ref 0–200)
HDL: 58.5 mg/dL (ref >39.00)
LDL Cholesterol: 83 mg/dL (ref 0–99)
NonHDL: 95.72
Total CHOL/HDL Ratio: 3
Triglycerides: 64 mg/dL (ref 0.0–149.0)
VLDL: 12.8 mg/dL (ref 0.0–40.0)

## 2021-12-29 LAB — HEMOGLOBIN A1C: Hgb A1c MFr Bld: 6.3 % (ref 4.6–6.5)

## 2021-12-29 LAB — CBC
HCT: 41.9 % (ref 36.0–46.0)
Hemoglobin: 13.8 g/dL (ref 12.0–15.0)
MCHC: 32.9 g/dL (ref 30.0–36.0)
MCV: 89.7 fl (ref 78.0–100.0)
Platelets: 252 10*3/uL (ref 150.0–400.0)
RBC: 4.67 Mil/uL (ref 3.87–5.11)
RDW: 14.2 % (ref 11.5–15.5)
WBC: 7.4 10*3/uL (ref 4.0–10.5)

## 2021-12-29 MED ORDER — ATORVASTATIN CALCIUM 40 MG PO TABS: 40.0000 mg | ORAL_TABLET | Freq: Every day | ORAL | 3 refills | Status: DC

## 2021-12-29 MED ORDER — SYNTHROID 100 MCG PO TABS: 100.0000 ug | ORAL_TABLET | Freq: Every day | ORAL | 3 refills | Status: DC

## 2021-12-29 NOTE — Progress Notes (Signed)
Chief Complaint:  [name] is a 76 y.o. female who presents today for her annual comprehensive physical exam.    Assessment/Plan:  Chronic Problems Addressed Today: Hyperglycemia Check A1c.   Coronary artery disease involving native coronary artery of native heart with angina pectoris ([place]) On statin.   Hyperlipidemia with target LDL less than 100 Check lipids.  Continue Lipitor 40 mg daily.  Cigarette smoker Patient was asked about her tobacco use today and was strongly advised to quit. Patient is currently non contemplative. We reviewed treatment options to assist her quit smoking including NRT, Chantix, and Bupropion. Follow up at next office visit.   Total time spent counseling approximately 3 minutes.   She has lung screening annually.    Hypothyroidism On Synthroid 100 mcg daily.  Check TSH.   Preventative Healthcare: Check labs.  Has lung cancer screening performed annually.  Up-to-date on mammogram.  Longer wants colonoscopy due to age.  Patient Counseling(The following topics were reviewed and/or handout was given):  -Nutrition: Stressed importance of moderation in sodium/caffeine intake, saturated fat and cholesterol, caloric balance, sufficient intake of fresh fruits, vegetables, and fiber.  -Stressed the importance of regular exercise.   -Substance Abuse: Discussed cessation/primary prevention of tobacco, alcohol, or other drug use; driving or other dangerous activities under the influence; availability of treatment for abuse.   -Injury prevention: Discussed safety belts, safety helmets, smoke detector, smoking near bedding or upholstery.   -Sexuality: Discussed sexually transmitted diseases, partner selection, use of condoms, avoidance of unintended pregnancy and contraceptive alternatives.   -Dental health: Discussed importance of regular tooth brushing, flossing, and dental visits.  -Health maintenance and immunizations reviewed. Please refer to Health  maintenance section.  Return to care in 1 year for next preventative visit.     Subjective:  HPI:  She has no acute complaints today.   Lifestyle Diet: Balanced. Plenty of fruits and vegetables.  Exercise: Likes walking. Likes golfing.   Depression screen [facility] 2/9 12/29/2021  Decreased Interest 0  Down, Depressed, Hopeless 0  PHQ - 2 Score 0   ROS: Per HPI, otherwise a complete review of systems was negative.   PMH:  The following were reviewed and entered/updated in epic: Past Medical History:  Diagnosis Date   Cataract    DIVERTICULITIS, HX OF 11/13/2010   HYPOTHYROIDISM 11/13/2010   MIGRAINE HEADACHE 11/13/2010   SKIN CANCER, HX OF 11/13/2010   TOBACCO ABUSE 11/13/2010   Patient Active Problem List   Diagnosis Date Noted   Hyperglycemia 12/02/2020   Coronary artery disease involving native coronary artery of native heart with angina pectoris ([place]) 06/23/2019   Precordial pain 05/25/2018   Hyperlipidemia with target LDL less than 100 05/25/2018   Hypothyroidism 11/13/2010   Cigarette smoker 11/13/2010   MIGRAINE HEADACHE 11/13/2010   SKIN CANCER, HX OF 11/13/2010   DIVERTICULITIS, HX OF 11/13/2010   Past Surgical History:  Procedure Laterality Date   NM MYOVIEW LTD  05/2018    EF~55%. Normal BP response.  No EKG change.  LOW RISK. No Ischemia or Infarction.  (Mild Breast Attenuation)   THYROIDECTOMY  2004    Family History  Problem Relation Age of Onset   Cancer Maternal Aunt        breast   Cancer Paternal Aunt        breast   Cancer Maternal Grandmother        breast   Cancer Paternal Grandmother        breast   Cancer  Mother 85       Kidney   Atrial fibrillation Father    Heart attack Father 75    Medications- reviewed and updated Current Outpatient Medications  Medication Sig Dispense Refill   atorvastatin (LIPITOR) 40 MG tablet Take 1 tablet (40 mg total) by mouth daily. 90 tablet 3   SYNTHROID 100 MCG tablet Take 1 tablet (100 mcg total)  by mouth daily before breakfast. 90 tablet 3   No current facility-administered medications for this visit.    Allergies-reviewed and updated Allergies  Allergen Reactions   Codeine Sulfate     REACTION: gi upset    Social History   Socioeconomic History   Marital status: Married    Spouse name: Not on file   Number of children: Not on file   Years of education: Not on file   Highest education level: Not on file  Occupational History   Not on file  Tobacco Use   Smoking status: Every Day    Packs/day: 1.00    Years: 35.00    Pack years: 35.00    Types: Cigarettes   Smokeless tobacco: Never  Substance and Sexual Activity   Alcohol use: Not on file   Drug use: Not on file   Sexual activity: Not on file  Other Topics Concern   Not on file  Social History Narrative   Not on file   Social Determinants of Health   Financial Resource Strain: Not on file  Food Insecurity: Not on file  Transportation Needs: Not on file  Physical Activity: Not on file  Stress: Not on file  Social Connections: Not on file        Objective:  Physical Exam: BP 138/84 (BP Location: Right Arm)    Pulse 70    Temp 98 F (36.7 C) (Temporal)    Ht 5' 4.5" (1.638 m)    Wt 161 lb (73 kg)    SpO2 98%    BMI 27.21 kg/m   Body mass index is 27.21 kg/m. Wt Readings from Last 3 Encounters:  12/29/21 161 lb (73 kg)  12/02/20 163 lb 12.8 oz (74.3 kg)  06/23/19 160 lb 6.1 oz (72.7 kg)   Gen: NAD, resting comfortably HEENT: TMs normal bilaterally. OP clear. No thyromegaly noted.  CV: RRR with no murmurs appreciated Pulm: NWOB, CTAB with no crackles, wheezes, or rhonchi GI: Normal bowel sounds present. Soft, Nontender, Nondistended. MSK: no edema, cyanosis, or clubbing noted Skin: warm, dry Neuro: CN2-12 grossly intact. Strength 5/5 in upper and lower extremities. Reflexes symmetric and intact bilaterally.  Psych: Normal affect and thought content     Atticus Lemberger M. [name], MD 12/29/2021 2:15 PM

## 2021-12-29 NOTE — Assessment & Plan Note (Signed)
On Synthroid 100 mcg daily.  Check TSH. 

## 2021-12-29 NOTE — Assessment & Plan Note (Signed)
Check lipids.  Continue Lipitor 40 mg daily. °

## 2021-12-29 NOTE — Assessment & Plan Note (Signed)
Patient was asked about her tobacco use today and was strongly advised to quit. Patient is currently non contemplative. We reviewed treatment options to assist her quit smoking including NRT, Chantix, and Bupropion. Follow up at next office visit.   Total time spent counseling approximately 3 minutes.   She has lung screening annually.

## 2021-12-29 NOTE — Patient Instructions (Signed)
It was very nice to see you today!  We will check blood work today.  Please let me know if you would like help with stopping smoking.  Please continue to work on diet and exercise.  I will see you back in a year for your next physical.  Come back sooner if needed.  Take care, Dr [name]  PLEASE NOTE:  If you had any lab tests please let us know if you have not heard back within a few days. You may see your results on mychart before we have a chance to review them but we will give you a call once they are reviewed by [place]. If we ordered any referrals today, please let us know if you have not heard from their office within the next week.   Please try these tips to maintain a healthy lifestyle:  Eat at least 3 REAL meals and 1-2 snacks per day.  Aim for no more than 5 hours between eating.  If you eat breakfast, please do so within one hour of getting up.   Each meal should contain half fruits/vegetables, one quarter protein, and one quarter carbs (no bigger than a computer mouse)  Cut down on sweet beverages. This includes juice, soda, and sweet tea.   Drink at least 1 glass of water with each meal and aim for at least 8 glasses per day  Exercise at least 150 minutes every week.    Preventive Care 75 Years and Older, Female Preventive care refers to lifestyle choices and visits with your health care provider that can promote health and wellness. Preventive care visits are also called wellness exams. What can I expect for my preventive care visit? Counseling Your health care provider may ask you questions about your: Medical history, including: Past medical problems. Family medical history. Pregnancy and menstrual history. History of falls. Current health, including: Memory and ability to understand (cognition). Emotional well-being. Home life and relationship well-being. Sexual activity and sexual health. Lifestyle, including: Alcohol, nicotine or tobacco, and drug  use. Access to firearms. Diet, exercise, and sleep habits. Work and work Statistician. Sunscreen use. Safety issues such as seatbelt and bike helmet use. Physical exam Your health care provider will check your: Height and weight. These may be used to calculate your BMI (body mass index). BMI is a measurement that tells if you are at a healthy weight. Waist circumference. This measures the distance around your waistline. This measurement also tells if you are at a healthy weight and may help predict your risk of certain diseases, such as type 2 diabetes and high blood pressure. Heart rate and blood pressure. Body temperature. Skin for abnormal spots. What immunizations do I need? Vaccines are usually given at various ages, according to a schedule. Your health care provider will recommend vaccines for you based on your age, medical history, and lifestyle or other factors, such as travel or where you work. What tests do I need? Screening Your health care provider may recommend screening tests for certain conditions. This may include: Lipid and cholesterol levels. Hepatitis C test. Hepatitis B test. HIV (human immunodeficiency virus) test. STI (sexually transmitted infection) testing, if you are at risk. Lung cancer screening. Colorectal cancer screening. Diabetes screening. This is done by checking your blood sugar (glucose) after you have not eaten for a while (fasting). Mammogram. Talk with your health care provider about how often you should have regular mammograms. BRCA-related cancer screening. This may be done if you have a family history of  breast, ovarian, tubal, or peritoneal cancers. Bone density scan. This is done to screen for osteoporosis. Talk with your health care provider about your test results, treatment options, and if necessary, the need for more tests. Follow these instructions at home: Eating and drinking  Eat a diet that includes fresh fruits and vegetables, whole  grains, lean protein, and low-fat dairy products. Limit your intake of foods with high amounts of sugar, saturated fats, and salt. Take vitamin and mineral supplements as recommended by your health care provider. Do not drink alcohol if your health care provider tells you not to drink. If you drink alcohol: Limit how much you have to 0-1 drink a day. Know how much alcohol is in your drink. In the [place] one drink equals one 12 oz bottle of beer (355 mL), one 5 oz glass of wine (148 mL), or one 1 oz glass of hard liquor (44 mL). Lifestyle Brush your teeth every morning and night with fluoride toothpaste. Floss one time each day. Exercise for at least 30 minutes 5 or more days each week. Do not use any products that contain nicotine or tobacco. These products include cigarettes, chewing tobacco, and vaping devices, such as e-cigarettes. If you need help quitting, ask your health care provider. Do not use drugs. If you are sexually active, practice safe sex. Use a condom or other form of protection in order to prevent STIs. Take aspirin only as told by your health care provider. Make sure that you understand how much to take and what form to take. Work with your health care provider to find out whether it is safe and beneficial for you to take aspirin daily. Ask your health care provider if you need to take a cholesterol-lowering medicine (statin). Find healthy ways to manage stress, such as: Meditation, yoga, or listening to music. Journaling. Talking to a trusted person. Spending time with friends and family. Minimize exposure to UV radiation to reduce your risk of skin cancer. Safety Always wear your seat belt while driving or riding in a vehicle. Do not drive: If you have been drinking alcohol. Do not ride with someone who has been drinking. When you are tired or distracted. While texting. If you have been using any mind-altering substances or drugs. Wear a helmet and other protective  equipment during sports activities. If you have firearms in your house, make sure you follow all gun safety procedures. What's next? Visit your health care provider once a year for an annual wellness visit. Ask your health care provider how often you should have your eyes and teeth checked. Stay up to date on all vaccines. This information is not intended to replace advice given to you by your health care provider. Make sure you discuss any questions you have with your health care provider. Document Revised: 04/16/2021 Document Reviewed: 04/16/2021 Elsevier Patient Education  [place].

## 2021-12-29 NOTE — Assessment & Plan Note (Signed)
>>  ASSESSMENT AND PLAN FOR HYPERGLYCEMIA WRITTEN ON 12/29/2021  2:14 PM BY Ardith Dark, MD  Check A1c.

## 2021-12-29 NOTE — Assessment & Plan Note (Signed)
On statin.

## 2021-12-29 NOTE — Assessment & Plan Note (Signed)
Check A1c. 

## 2021-12-30 LAB — TSH: TSH: 5.49 u[IU]/mL (ref 0.35–5.50)

## 2021-12-31 NOTE — Progress Notes (Signed)
Please inform patient of the following:  Labs are all stable. Do not need to make any changes to her treatment plan at this time. We can recheck in a year.  Algis Greenhouse. [name], MD 12/31/2021 8:05 AM

## 2022-01-01 ENCOUNTER — Telehealth: Payer: Self-pay | Admitting: Family Medicine

## 2022-01-01 NOTE — Telephone Encounter (Signed)
Patient returned call regarding labs and request a call back. ?

## 2022-01-01 NOTE — Telephone Encounter (Signed)
Calling for lab results - requesting a call back.  ?

## 2022-01-01 NOTE — Telephone Encounter (Signed)
See lab note.  

## 2022-02-03 ENCOUNTER — Telehealth: Payer: Self-pay | Admitting: Family Medicine

## 2022-02-03 NOTE — Telephone Encounter (Signed)
Copied from [place] [id number]. Topic: Medicare AWV ?>> Feb 03, 2022  1:21 PM Harris-Coley, Hannah Beat wrote: ?Reason for CRM: Left message for patient to schedule Annual Wellness Visit.  Please schedule with Nurse Health Advisor Charlott Rakes, RN at [facility].  Please call [phone] ask for Juliann Pulse ?

## 2022-02-05 DIAGNOSIS — C44729 Squamous cell carcinoma of skin of left lower limb, including hip: Secondary | ICD-10-CM | POA: Diagnosis not present

## 2022-02-05 DIAGNOSIS — D485 Neoplasm of uncertain behavior of skin: Secondary | ICD-10-CM | POA: Diagnosis not present

## 2022-02-05 DIAGNOSIS — Z85828 Personal history of other malignant neoplasm of skin: Secondary | ICD-10-CM | POA: Diagnosis not present

## 2022-05-11 DIAGNOSIS — D485 Neoplasm of uncertain behavior of skin: Secondary | ICD-10-CM | POA: Diagnosis not present

## 2022-05-11 DIAGNOSIS — L570 Actinic keratosis: Secondary | ICD-10-CM | POA: Diagnosis not present

## 2022-05-11 DIAGNOSIS — Z85828 Personal history of other malignant neoplasm of skin: Secondary | ICD-10-CM | POA: Diagnosis not present

## 2022-05-11 DIAGNOSIS — C44722 Squamous cell carcinoma of skin of right lower limb, including hip: Secondary | ICD-10-CM | POA: Diagnosis not present

## 2022-05-11 DIAGNOSIS — C44622 Squamous cell carcinoma of skin of right upper limb, including shoulder: Secondary | ICD-10-CM | POA: Diagnosis not present

## 2022-05-25 ENCOUNTER — Telehealth: Payer: Self-pay | Admitting: Family Medicine

## 2022-05-25 ENCOUNTER — Other Ambulatory Visit: Payer: Self-pay | Admitting: *Deleted

## 2022-05-25 DIAGNOSIS — E039 Hypothyroidism, unspecified: Secondary | ICD-10-CM

## 2022-05-25 NOTE — Telephone Encounter (Signed)
Please advise 

## 2022-05-25 NOTE — Telephone Encounter (Signed)
FYI--Called pt to schedule but due to no answer, I LVM.

## 2022-05-25 NOTE — Telephone Encounter (Signed)
Please schedule Lab appt, lab order placed

## 2022-05-25 NOTE — Telephone Encounter (Signed)
Patient requests to have Thyroid Labs done. Patient requests to be called at ph# [phone] once Thyroid Labs have been ordered.

## 2022-05-25 NOTE — Telephone Encounter (Signed)
Ok with me. Please place any necessary orders. 

## 2022-05-27 ENCOUNTER — Other Ambulatory Visit (INDEPENDENT_AMBULATORY_CARE_PROVIDER_SITE_OTHER): Payer: PPO

## 2022-05-27 DIAGNOSIS — E039 Hypothyroidism, unspecified: Secondary | ICD-10-CM | POA: Diagnosis not present

## 2022-05-28 ENCOUNTER — Ambulatory Visit (INDEPENDENT_AMBULATORY_CARE_PROVIDER_SITE_OTHER): Payer: PPO | Admitting: Family Medicine

## 2022-05-28 VITALS — BP 119/68 | HR 76 | Temp 98.3°F | Ht 64.5 in | Wt 165.2 lb

## 2022-05-28 DIAGNOSIS — R5383 Other fatigue: Secondary | ICD-10-CM | POA: Diagnosis not present

## 2022-05-28 DIAGNOSIS — E039 Hypothyroidism, unspecified: Secondary | ICD-10-CM

## 2022-05-28 DIAGNOSIS — M2550 Pain in unspecified joint: Secondary | ICD-10-CM

## 2022-05-28 DIAGNOSIS — R739 Hyperglycemia, unspecified: Secondary | ICD-10-CM | POA: Diagnosis not present

## 2022-05-28 LAB — TSH: TSH: 0.77 u[IU]/mL (ref 0.35–5.50)

## 2022-05-28 NOTE — Assessment & Plan Note (Signed)
Check A1c. 

## 2022-05-28 NOTE — Patient Instructions (Signed)
It was very nice to see you today!  We will check blood work today.  Take care, Dr [name]  PLEASE NOTE:  If you had any lab tests please let us know if you have not heard back within a few days. You may see your results on mychart before we have a chance to review them but we will give you a call once they are reviewed by [place]. If we ordered any referrals today, please let us know if you have not heard from their office within the next week.   Please try these tips to maintain a healthy lifestyle:  Eat at least 3 REAL meals and 1-2 snacks per day.  Aim for no more than 5 hours between eating.  If you eat breakfast, please do so within one hour of getting up.   Each meal should contain half fruits/vegetables, one quarter protein, and one quarter carbs (no bigger than a computer mouse)  Cut down on sweet beverages. This includes juice, soda, and sweet tea.   Drink at least 1 glass of water with each meal and aim for at least 8 glasses per day  Exercise at least 150 minutes every week.

## 2022-05-28 NOTE — Assessment & Plan Note (Signed)
>>  ASSESSMENT AND PLAN FOR HYPERGLYCEMIA WRITTEN ON 05/28/2022  3:58 PM BY Ardith Dark, MD  Check A1c.

## 2022-05-28 NOTE — Assessment & Plan Note (Signed)
Last TSH at goal on Synthroid 100 mcg daily.  We will check T4 and T3 today given her fatigue.

## 2022-05-28 NOTE — Progress Notes (Signed)
[name] is a 76 y.o. female who presents today for an office visit.  Assessment/Plan:  New/Acute Problems: Other Fatigue  We will check labs including CBC, c-Met, B12, vitamin D, and sedimentation rate.  She did have TSH yesterday which was within normal limits.  Joint Pain Likely secondary to arthritis.  We will check requested labs as above first.  May consider referral to sports medicine if she is interested if her labs are normal.  Chronic Problems Addressed Today: Hyperglycemia Check A1c.  Hypothyroidism Last TSH at goal on Synthroid 100 mcg daily.  We will check T4 and T3 today given her fatigue.     Subjective:   HPI:  See A/P for status of chronic conditions.  She has had concern for worsening pain in both of her legs for the last several weeks to months.  Initially located in her feet, then this progressed to involve pain in her ankles, then her knees, denies any discomfort in her back.  She is worried about low thyroid.  She is currently on Synthroid 100 mcg daily and has been compliant with this but is not sure if it is strong enough.  She has not noticed any weakness or numbness.  Worse with certain activities such as playing golf.  She has also noticed increased fatigue and low motivation which prompted her concern for low thyroid levels.        Objective:  Physical Exam: BP 119/68   Pulse 76   Temp 98.3 F (36.8 C) (Temporal)   Ht 5' 4.5" (1.638 m)   Wt 165 lb 3.2 oz (74.9 kg)   SpO2 98%   BMI 27.92 kg/m   Gen: No acute distress, resting comfortably CV: Regular rate and rhythm with no murmurs appreciated Pulm: Normal work of breathing, clear to auscultation bilaterally with no crackles, wheezes, or rhonchi MSk: Bilateral legs without obvious deformities.  Crepitus with active and passive range of motion in knees bilaterally Neuro: Grossly normal, moves all extremities Psych: Normal affect and thought content      Caleb M. Parker, MD 05/28/2022  3:58 PM  

## 2022-05-29 LAB — CBC
HCT: 39.4 % (ref 36.0–46.0)
Hemoglobin: 13.1 g/dL (ref 12.0–15.0)
MCHC: 33.3 g/dL (ref 30.0–36.0)
MCV: 91.2 fl (ref 78.0–100.0)
Platelets: 228 10*3/uL (ref 150.0–400.0)
RBC: 4.32 Mil/uL (ref 3.87–5.11)
RDW: 14.1 % (ref 11.5–15.5)
WBC: 8 10*3/uL (ref 4.0–10.5)

## 2022-05-29 LAB — T4, FREE: Free T4: 1.1 ng/dL (ref 0.60–1.60)

## 2022-05-29 LAB — COMPREHENSIVE METABOLIC PANEL
ALT: 22 U/L (ref 0–35)
AST: 15 U/L (ref 0–37)
Albumin: 4.3 g/dL (ref 3.5–5.2)
Alkaline Phosphatase: 97 U/L (ref 39–117)
BUN: 21 mg/dL (ref 6–23)
CO2: 29 mEq/L (ref 19–32)
Calcium: 9.1 mg/dL (ref 8.4–10.5)
Chloride: 105 mEq/L (ref 96–112)
Creatinine, Ser: 1.08 mg/dL (ref 0.40–1.20)
GFR: 49.96 mL/min — ABNORMAL LOW (ref >60.00)
Glucose, Bld: 91 mg/dL (ref 70–99)
Potassium: 4.3 mEq/L (ref 3.5–5.1)
Sodium: 141 mEq/L (ref 135–145)
Total Bilirubin: 0.3 mg/dL (ref 0.2–1.2)
Total Protein: 6.7 g/dL (ref 6.0–8.3)

## 2022-05-29 LAB — SEDIMENTATION RATE: Sed Rate: 13 mm/hr (ref 0–30)

## 2022-05-29 LAB — VITAMIN B12: Vitamin B-12: 220 pg/mL (ref 211–911)

## 2022-05-29 LAB — HEMOGLOBIN A1C: Hgb A1c MFr Bld: 6.5 % (ref 4.6–6.5)

## 2022-05-29 LAB — VITAMIN D 25 HYDROXY (VIT D DEFICIENCY, FRACTURES): VITD: 30.63 ng/mL (ref 30.00–100.00)

## 2022-05-29 LAB — T3, FREE: T3, Free: 2.5 pg/mL (ref 2.3–4.2)

## 2022-05-29 NOTE — Progress Notes (Signed)
Please inform patient of the following:  Her B12 is in the lower limit of normal.  This could be causing some of her fatigue issues.  Recommend we start B12 protocol here.  All of her thyroid test are normal.  Her blood sugar is up a bit since last time we checked.  Do not need to start meds but she should continue to work on diet and exercise and we can recheck this in about 6 months.

## 2022-06-01 DIAGNOSIS — H353112 Nonexudative age-related macular degeneration, right eye, intermediate dry stage: Secondary | ICD-10-CM | POA: Diagnosis not present

## 2022-06-16 DIAGNOSIS — H353132 Nonexudative age-related macular degeneration, bilateral, intermediate dry stage: Secondary | ICD-10-CM | POA: Diagnosis not present

## 2022-06-16 DIAGNOSIS — H35371 Puckering of macula, right eye: Secondary | ICD-10-CM | POA: Diagnosis not present

## 2022-06-16 DIAGNOSIS — H43392 Other vitreous opacities, left eye: Secondary | ICD-10-CM | POA: Diagnosis not present

## 2022-06-16 DIAGNOSIS — H43812 Vitreous degeneration, left eye: Secondary | ICD-10-CM | POA: Diagnosis not present

## 2022-06-16 DIAGNOSIS — H43823 Vitreomacular adhesion, bilateral: Secondary | ICD-10-CM | POA: Diagnosis not present

## 2022-06-29 DIAGNOSIS — D485 Neoplasm of uncertain behavior of skin: Secondary | ICD-10-CM | POA: Diagnosis not present

## 2022-06-29 DIAGNOSIS — C44729 Squamous cell carcinoma of skin of left lower limb, including hip: Secondary | ICD-10-CM | POA: Diagnosis not present

## 2022-06-29 DIAGNOSIS — L570 Actinic keratosis: Secondary | ICD-10-CM | POA: Diagnosis not present

## 2022-06-29 DIAGNOSIS — Z85828 Personal history of other malignant neoplasm of skin: Secondary | ICD-10-CM | POA: Diagnosis not present

## 2022-07-27 ENCOUNTER — Encounter: Payer: Self-pay | Admitting: *Deleted

## 2022-08-31 DIAGNOSIS — L570 Actinic keratosis: Secondary | ICD-10-CM | POA: Diagnosis not present

## 2022-08-31 DIAGNOSIS — C44622 Squamous cell carcinoma of skin of right upper limb, including shoulder: Secondary | ICD-10-CM | POA: Diagnosis not present

## 2022-08-31 DIAGNOSIS — D0472 Carcinoma in situ of skin of left lower limb, including hip: Secondary | ICD-10-CM | POA: Diagnosis not present

## 2022-08-31 DIAGNOSIS — D485 Neoplasm of uncertain behavior of skin: Secondary | ICD-10-CM | POA: Diagnosis not present

## 2022-08-31 DIAGNOSIS — Z85828 Personal history of other malignant neoplasm of skin: Secondary | ICD-10-CM | POA: Diagnosis not present

## 2022-09-18 ENCOUNTER — Ambulatory Visit (HOSPITAL_COMMUNITY)
Admission: RE | Admit: 2022-09-18 | Discharge: 2022-09-18 | Disposition: A | Discharge status: Home / Self Care | Payer: PPO | Source: Ambulatory Visit | Attending: Family Medicine | Admitting: Family Medicine

## 2022-09-18 DIAGNOSIS — Z122 Encounter for screening for malignant neoplasm of respiratory organs: Secondary | ICD-10-CM | POA: Insufficient documentation

## 2022-09-18 DIAGNOSIS — F17210 Nicotine dependence, cigarettes, uncomplicated: Secondary | ICD-10-CM | POA: Diagnosis not present

## 2022-09-18 DIAGNOSIS — J432 Centrilobular emphysema: Secondary | ICD-10-CM | POA: Diagnosis not present

## 2022-09-18 DIAGNOSIS — I2510 Atherosclerotic heart disease of native coronary artery without angina pectoris: Secondary | ICD-10-CM | POA: Diagnosis not present

## 2022-09-18 DIAGNOSIS — I700 Atherosclerosis of aorta: Secondary | ICD-10-CM | POA: Insufficient documentation

## 2022-09-18 DIAGNOSIS — Z87891 Personal history of nicotine dependence: Secondary | ICD-10-CM

## 2022-09-18 DIAGNOSIS — Z1231 Encounter for screening mammogram for malignant neoplasm of breast: Secondary | ICD-10-CM | POA: Diagnosis not present

## 2022-09-18 LAB — HM MAMMOGRAPHY

## 2022-09-21 ENCOUNTER — Other Ambulatory Visit: Payer: Self-pay | Admitting: Acute Care

## 2022-09-21 DIAGNOSIS — Z87891 Personal history of nicotine dependence: Secondary | ICD-10-CM

## 2022-09-21 DIAGNOSIS — F17210 Nicotine dependence, cigarettes, uncomplicated: Secondary | ICD-10-CM

## 2022-09-21 DIAGNOSIS — Z122 Encounter for screening for malignant neoplasm of respiratory organs: Secondary | ICD-10-CM

## 2022-09-22 ENCOUNTER — Encounter: Payer: Self-pay | Admitting: Family Medicine

## 2022-10-13 DIAGNOSIS — C44622 Squamous cell carcinoma of skin of right upper limb, including shoulder: Secondary | ICD-10-CM | POA: Diagnosis not present

## 2022-10-13 DIAGNOSIS — D485 Neoplasm of uncertain behavior of skin: Secondary | ICD-10-CM | POA: Diagnosis not present

## 2022-10-13 DIAGNOSIS — C44722 Squamous cell carcinoma of skin of right lower limb, including hip: Secondary | ICD-10-CM | POA: Diagnosis not present

## 2022-10-13 DIAGNOSIS — Z85828 Personal history of other malignant neoplasm of skin: Secondary | ICD-10-CM | POA: Diagnosis not present

## 2022-10-13 DIAGNOSIS — C44729 Squamous cell carcinoma of skin of left lower limb, including hip: Secondary | ICD-10-CM | POA: Diagnosis not present

## 2022-10-15 ENCOUNTER — Encounter: Payer: Self-pay | Admitting: *Deleted

## 2022-12-01 DIAGNOSIS — L570 Actinic keratosis: Secondary | ICD-10-CM | POA: Diagnosis not present

## 2022-12-01 DIAGNOSIS — L905 Scar conditions and fibrosis of skin: Secondary | ICD-10-CM | POA: Diagnosis not present

## 2022-12-01 DIAGNOSIS — L821 Other seborrheic keratosis: Secondary | ICD-10-CM | POA: Diagnosis not present

## 2022-12-01 DIAGNOSIS — D485 Neoplasm of uncertain behavior of skin: Secondary | ICD-10-CM | POA: Diagnosis not present

## 2022-12-01 DIAGNOSIS — D4819 Other specified neoplasm of uncertain behavior of connective and other soft tissue: Secondary | ICD-10-CM | POA: Diagnosis not present

## 2022-12-01 DIAGNOSIS — Z85828 Personal history of other malignant neoplasm of skin: Secondary | ICD-10-CM | POA: Diagnosis not present

## 2022-12-01 DIAGNOSIS — C44722 Squamous cell carcinoma of skin of right lower limb, including hip: Secondary | ICD-10-CM | POA: Diagnosis not present

## 2022-12-01 DIAGNOSIS — C4442 Squamous cell carcinoma of skin of scalp and neck: Secondary | ICD-10-CM | POA: Diagnosis not present

## 2022-12-14 DIAGNOSIS — C44729 Squamous cell carcinoma of skin of left lower limb, including hip: Secondary | ICD-10-CM | POA: Diagnosis not present

## 2022-12-14 DIAGNOSIS — Z85828 Personal history of other malignant neoplasm of skin: Secondary | ICD-10-CM | POA: Diagnosis not present

## 2022-12-14 DIAGNOSIS — C44722 Squamous cell carcinoma of skin of right lower limb, including hip: Secondary | ICD-10-CM | POA: Diagnosis not present

## 2022-12-28 ENCOUNTER — Other Ambulatory Visit: Payer: Self-pay | Admitting: Family Medicine

## 2022-12-30 ENCOUNTER — Encounter: Payer: Self-pay | Admitting: Family Medicine

## 2022-12-30 ENCOUNTER — Ambulatory Visit (INDEPENDENT_AMBULATORY_CARE_PROVIDER_SITE_OTHER): Payer: PPO | Admitting: Family Medicine

## 2022-12-30 VITALS — BP 130/78 | HR 70 | Temp 98.2°F | Ht 64.5 in | Wt 164.6 lb

## 2022-12-30 DIAGNOSIS — Z0001 Encounter for general adult medical examination with abnormal findings: Secondary | ICD-10-CM | POA: Diagnosis not present

## 2022-12-30 DIAGNOSIS — E538 Deficiency of other specified B group vitamins: Secondary | ICD-10-CM | POA: Diagnosis not present

## 2022-12-30 DIAGNOSIS — I25119 Atherosclerotic heart disease of native coronary artery with unspecified angina pectoris: Secondary | ICD-10-CM

## 2022-12-30 DIAGNOSIS — E039 Hypothyroidism, unspecified: Secondary | ICD-10-CM

## 2022-12-30 DIAGNOSIS — E559 Vitamin D deficiency, unspecified: Secondary | ICD-10-CM

## 2022-12-30 DIAGNOSIS — E785 Hyperlipidemia, unspecified: Secondary | ICD-10-CM

## 2022-12-30 DIAGNOSIS — F17210 Nicotine dependence, cigarettes, uncomplicated: Secondary | ICD-10-CM

## 2022-12-30 DIAGNOSIS — R739 Hyperglycemia, unspecified: Secondary | ICD-10-CM

## 2022-12-30 LAB — COMPREHENSIVE METABOLIC PANEL
ALT: 22 U/L (ref 0–35)
AST: 17 U/L (ref 0–37)
Albumin: 3.9 g/dL (ref 3.5–5.2)
Alkaline Phosphatase: 89 U/L (ref 39–117)
BUN: 18 mg/dL (ref 6–23)
CO2: 31 mEq/L (ref 19–32)
Calcium: 9.9 mg/dL (ref 8.4–10.5)
Chloride: 103 mEq/L (ref 96–112)
Creatinine, Ser: 0.9 mg/dL (ref 0.40–1.20)
GFR: 61.92 mL/min (ref >60.00)
Glucose, Bld: 99 mg/dL (ref 70–99)
Potassium: 5.4 mEq/L — ABNORMAL HIGH (ref 3.5–5.1)
Sodium: 141 mEq/L (ref 135–145)
Total Bilirubin: 0.4 mg/dL (ref 0.2–1.2)
Total Protein: 6.7 g/dL (ref 6.0–8.3)

## 2022-12-30 LAB — CBC
HCT: 43.7 % (ref 36.0–46.0)
Hemoglobin: 14.6 g/dL (ref 12.0–15.0)
MCHC: 33.3 g/dL (ref 30.0–36.0)
MCV: 90.2 fl (ref 78.0–100.0)
Platelets: 254 10*3/uL (ref 150.0–400.0)
RBC: 4.85 Mil/uL (ref 3.87–5.11)
RDW: 14.1 % (ref 11.5–15.5)
WBC: 6.1 10*3/uL (ref 4.0–10.5)

## 2022-12-30 LAB — LIPID PANEL
Cholesterol: 239 mg/dL — ABNORMAL HIGH (ref 0–200)
HDL: 68.5 mg/dL (ref >39.00)
LDL Cholesterol: 156 mg/dL — ABNORMAL HIGH (ref 0–99)
NonHDL: 170.58
Total CHOL/HDL Ratio: 3
Triglycerides: 72 mg/dL (ref 0.0–149.0)
VLDL: 14.4 mg/dL (ref 0.0–40.0)

## 2022-12-30 LAB — VITAMIN B12: Vitamin B-12: 223 pg/mL (ref 211–911)

## 2022-12-30 LAB — TSH: TSH: 4.5 u[IU]/mL (ref 0.35–5.50)

## 2022-12-30 LAB — VITAMIN D 25 HYDROXY (VIT D DEFICIENCY, FRACTURES): VITD: 21.66 ng/mL — ABNORMAL LOW (ref 30.00–100.00)

## 2022-12-30 LAB — HEMOGLOBIN A1C: Hgb A1c MFr Bld: 6.3 % (ref 4.6–6.5)

## 2022-12-30 NOTE — Progress Notes (Signed)
Chief Complaint:  [name] is a 77 y.o. female who presents today for her annual comprehensive physical exam.    Assessment/Plan:  Chronic Problems Addressed Today: Hypothyroidism Check TSH.  She is doing well on Synthroid 100 mcg daily.  Cigarette smoker Patient was asked about her tobacco use today and was strongly advised to quit. Patient is currently contemplative. We reviewed treatment options to assist her quit smoking including NRT, Chantix, and Bupropion. Follow up at next office visit.   Total time spent counseling approximately 3 minutes.    Hyperlipidemia with target LDL less than 100 She stopped taking Lipitor due to concern for myalgias.  Will check labs today.  Will likely need to restart statin.  May consider lower dose to see if this helps with her myalgias.  Hyperglycemia Check A1c.  B12 deficiency Check B12  Vitamin D deficiency Check vitamin D.   Preventative Healthcare: Check labs.  Up-to-date on mammogram and lung cancer screening.  No longer wants colonoscopy due to age.   Patient Counseling(The following topics were reviewed and/or handout was given):  -Nutrition: Stressed importance of moderation in sodium/caffeine intake, saturated fat and cholesterol, caloric balance, sufficient intake of fresh fruits, vegetables, and fiber.  -Stressed the importance of regular exercise.   -Substance Abuse: Discussed cessation/primary prevention of tobacco, alcohol, or other drug use; driving or other dangerous activities under the influence; availability of treatment for abuse.   -Injury prevention: Discussed safety belts, safety helmets, smoke detector, smoking near bedding or upholstery.   -Sexuality: Discussed sexually transmitted diseases, partner selection, use of condoms, avoidance of unintended pregnancy and contraceptive alternatives.   -Dental health: Discussed importance of regular tooth brushing, flossing, and dental visits.  -Health maintenance and  immunizations reviewed. Please refer to Health maintenance section.  Return to care in 1 year for next preventative visit.     Subjective:  HPI:  She has no acute complaints today.   She stopped her statin about 4 months ago. She was having some hip and knee pain. She was concerned this was due to statin. Her pain improved after stopping her lipitor.   Lifestyle Diet: None specific.  Exercise: Goes to gym twice weekly. Some resistance training and cardio.      12/30/2022    8:39 AM  Depression screen PHQ 2/9  Decreased Interest 0  Down, Depressed, Hopeless 0  PHQ - 2 Score 0    Health Maintenance Due  Topic Date Due   Medicare Annual Wellness (AWV)  Never done   DTaP/Tdap/Td (2 - Td or Tdap) 03/31/2021   COVID-19 Vaccine (5 - 2023-24 season) 01/01/2023     ROS: Per HPI, otherwise a complete review of systems was negative.   PMH:  The following were reviewed and entered/updated in epic: Past Medical History:  Diagnosis Date   Cataract    DIVERTICULITIS, HX OF 11/13/2010   HYPOTHYROIDISM 11/13/2010   MIGRAINE HEADACHE 11/13/2010   SKIN CANCER, HX OF 11/13/2010   TOBACCO ABUSE 11/13/2010   Patient Active Problem List   Diagnosis Date Noted   B12 deficiency 12/30/2022   Vitamin D deficiency 12/30/2022   Hyperglycemia 12/02/2020   Coronary artery disease involving native coronary artery of native heart with angina pectoris ([place]) 06/23/2019   Precordial pain 05/25/2018   Hyperlipidemia with target LDL less than 100 05/25/2018   Hypothyroidism 11/13/2010   Cigarette smoker 11/13/2010   MIGRAINE HEADACHE 11/13/2010   SKIN CANCER, HX OF 11/13/2010   DIVERTICULITIS, HX OF 11/13/2010  Past Surgical History:  Procedure Laterality Date   NM MYOVIEW LTD  05/2018    EF~55%. Normal BP response.  No EKG change.  LOW RISK. No Ischemia or Infarction.  (Mild Breast Attenuation)   THYROIDECTOMY  2004    Family History  Problem Relation Age of Onset   Cancer Maternal  Aunt        breast   Cancer Paternal Aunt        breast   Cancer Maternal Grandmother        breast   Cancer Paternal Grandmother        breast   Cancer Mother 22       Kidney   Atrial fibrillation Father    Heart attack Father 85    Medications- reviewed and updated Current Outpatient Medications  Medication Sig Dispense Refill   SYNTHROID 100 MCG tablet TAKE 1 TABLET BY MOUTH DAILY BEFORE BREAKFAST. 90 tablet 3   No current facility-administered medications for this visit.    Allergies-reviewed and updated Allergies  Allergen Reactions   Codeine Sulfate     REACTION: gi upset    Social History   Socioeconomic History   Marital status: Married    Spouse name: Not on file   Number of children: Not on file   Years of education: Not on file   Highest education level: Not on file  Occupational History   Not on file  Tobacco Use   Smoking status: Every Day    Packs/day: 1.00    Years: 35.00    Total pack years: 35.00    Types: Cigarettes   Smokeless tobacco: Never  Substance and Sexual Activity   Alcohol use: Not on file   Drug use: Not on file   Sexual activity: Not on file  Other Topics Concern   Not on file  Social History Narrative   Not on file   Social Determinants of Health   Financial Resource Strain: Not on file  Food Insecurity: Not on file  Transportation Needs: Not on file  Physical Activity: Not on file  Stress: Not on file  Social Connections: Not on file        Objective:  Physical Exam: BP 130/78   Pulse 70   Temp 98.2 F (36.8 C) (Temporal)   Ht 5' 4.5" (1.638 m)   Wt 164 lb 9.6 oz (74.7 kg)   SpO2 96%   BMI 27.82 kg/m   Body mass index is 27.82 kg/m. Wt Readings from Last 3 Encounters:  12/30/22 164 lb 9.6 oz (74.7 kg)  05/28/22 165 lb 3.2 oz (74.9 kg)  12/29/21 161 lb (73 kg)   Gen: NAD, resting comfortably HEENT: TMs normal bilaterally. OP clear. No thyromegaly noted.  CV: RRR with no murmurs appreciated Pulm:  NWOB, CTAB with no crackles, wheezes, or rhonchi GI: Normal bowel sounds present. Soft, Nontender, Nondistended. MSK: no edema, cyanosis, or clubbing noted Skin: warm, dry Neuro: CN2-12 grossly intact. Strength 5/5 in upper and lower extremities. Reflexes symmetric and intact bilaterally.  Psych: Normal affect and thought content     Erice Ahles M. [name], MD 12/30/2022 9:25 AM

## 2022-12-30 NOTE — Assessment & Plan Note (Signed)
Check vitamin D. 

## 2022-12-30 NOTE — Assessment & Plan Note (Signed)
She stopped taking Lipitor due to concern for myalgias.  Will check labs today.  Will likely need to restart statin.  May consider lower dose to see if this helps with her myalgias.

## 2022-12-30 NOTE — Assessment & Plan Note (Signed)
Check TSH.  She is doing well on Synthroid 100 mcg daily.

## 2022-12-30 NOTE — Patient Instructions (Signed)
It was very nice to see you today!  We will check blood work today.  Please keep up the good work with your diet and exercise.  We may need to restart a cholesterol medication depending on the results today.  Will see you back in year for your next physical.  Come back sooner if needed.  Take care, Dr [name]  PLEASE NOTE:  If you had any lab tests, please let us know if you have not heard back within a few days. You may see your results on mychart before we have a chance to review them but we will give you a call once they are reviewed by [place].   If we ordered any referrals today, please let us know if you have not heard from their office within the next week.   If you had any urgent prescriptions sent in today, please check with the pharmacy within an hour of our visit to make sure the prescription was transmitted appropriately.   Please try these tips to maintain a healthy lifestyle:  Eat at least 3 REAL meals and 1-2 snacks per day.  Aim for no more than 5 hours between eating.  If you eat breakfast, please do so within one hour of getting up.   Each meal should contain half fruits/vegetables, one quarter protein, and one quarter carbs (no bigger than a computer mouse)  Cut down on sweet beverages. This includes juice, soda, and sweet tea.   Drink at least 1 glass of water with each meal and aim for at least 8 glasses per day  Exercise at least 150 minutes every week.    Preventive Care 48 Years and Older, Female Preventive care refers to lifestyle choices and visits with your health care provider that can promote health and wellness. Preventive care visits are also called wellness exams. What can I expect for my preventive care visit? Counseling Your health care provider may ask you questions about your: Medical history, including: Past medical problems. Family medical history. Pregnancy and menstrual history. History of falls. Current health, including: Memory and  ability to understand (cognition). Emotional well-being. Home life and relationship well-being. Sexual activity and sexual health. Lifestyle, including: Alcohol, nicotine or tobacco, and drug use. Access to firearms. Diet, exercise, and sleep habits. Work and work Statistician. Sunscreen use. Safety issues such as seatbelt and bike helmet use. Physical exam Your health care provider will check your: Height and weight. These may be used to calculate your BMI (body mass index). BMI is a measurement that tells if you are at a healthy weight. Waist circumference. This measures the distance around your waistline. This measurement also tells if you are at a healthy weight and may help predict your risk of certain diseases, such as type 2 diabetes and high blood pressure. Heart rate and blood pressure. Body temperature. Skin for abnormal spots. What immunizations do I need?  Vaccines are usually given at various ages, according to a schedule. Your health care provider will recommend vaccines for you based on your age, medical history, and lifestyle or other factors, such as travel or where you work. What tests do I need? Screening Your health care provider may recommend screening tests for certain conditions. This may include: Lipid and cholesterol levels. Hepatitis C test. Hepatitis B test. HIV (human immunodeficiency virus) test. STI (sexually transmitted infection) testing, if you are at risk. Lung cancer screening. Colorectal cancer screening. Diabetes screening. This is done by checking your blood sugar (glucose) after you have  not eaten for a while (fasting). Mammogram. Talk with your health care provider about how often you should have regular mammograms. BRCA-related cancer screening. This may be done if you have a family history of breast, ovarian, tubal, or peritoneal cancers. Bone density scan. This is done to screen for osteoporosis. Talk with your health care provider about  your test results, treatment options, and if necessary, the need for more tests. Follow these instructions at home: Eating and drinking  Eat a diet that includes fresh fruits and vegetables, whole grains, lean protein, and low-fat dairy products. Limit your intake of foods with high amounts of sugar, saturated fats, and salt. Take vitamin and mineral supplements as recommended by your health care provider. Do not drink alcohol if your health care provider tells you not to drink. If you drink alcohol: Limit how much you have to 0-1 drink a day. Know how much alcohol is in your drink. In the [place] one drink equals one 12 oz bottle of beer (355 mL), one 5 oz glass of wine (148 mL), or one 1 oz glass of hard liquor (44 mL). Lifestyle Brush your teeth every morning and night with fluoride toothpaste. Floss one time each day. Exercise for at least 30 minutes 5 or more days each week. Do not use any products that contain nicotine or tobacco. These products include cigarettes, chewing tobacco, and vaping devices, such as e-cigarettes. If you need help quitting, ask your health care provider. Do not use drugs. If you are sexually active, practice safe sex. Use a condom or other form of protection in order to prevent STIs. Take aspirin only as told by your health care provider. Make sure that you understand how much to take and what form to take. Work with your health care provider to find out whether it is safe and beneficial for you to take aspirin daily. Ask your health care provider if you need to take a cholesterol-lowering medicine (statin). Find healthy ways to manage stress, such as: Meditation, yoga, or listening to music. Journaling. Talking to a trusted person. Spending time with friends and family. Minimize exposure to UV radiation to reduce your risk of skin cancer. Safety Always wear your seat belt while driving or riding in a vehicle. Do not drive: If you have been drinking alcohol.  Do not ride with someone who has been drinking. When you are tired or distracted. While texting. If you have been using any mind-altering substances or drugs. Wear a helmet and other protective equipment during sports activities. If you have firearms in your house, make sure you follow all gun safety procedures. What's next? Visit your health care provider once a year for an annual wellness visit. Ask your health care provider how often you should have your eyes and teeth checked. Stay up to date on all vaccines. This information is not intended to replace advice given to you by your health care provider. Make sure you discuss any questions you have with your health care provider. Document Revised: 04/16/2021 Document Reviewed: 04/16/2021 Elsevier Patient Education  [employer].

## 2022-12-30 NOTE — Assessment & Plan Note (Signed)
Check B12 

## 2022-12-30 NOTE — Assessment & Plan Note (Signed)
Patient was asked about her tobacco use today and was strongly advised to quit. Patient is currently contemplative. We reviewed treatment options to assist her quit smoking including NRT, Chantix, and Bupropion. Follow up at next office visit.   Total time spent counseling approximately 3 minutes.

## 2022-12-30 NOTE — Assessment & Plan Note (Signed)
Check A1c. 

## 2022-12-30 NOTE — Assessment & Plan Note (Signed)
>>  ASSESSMENT AND PLAN FOR HYPERGLYCEMIA WRITTEN ON 12/30/2022  9:24 AM BY Ardith Dark, MD  Check A1c.

## 2023-01-01 ENCOUNTER — Encounter: Payer: Self-pay | Admitting: Family Medicine

## 2023-01-01 DIAGNOSIS — R7303 Prediabetes: Secondary | ICD-10-CM | POA: Insufficient documentation

## 2023-01-01 NOTE — Progress Notes (Signed)
Please inform patient of the following:  Cholesterol levels up quite a bit.  We do need to try to get this lower to reduce her risk of heart attack and stroke.  We can try sending in a lower dose of Lipitor to see if this will help prevent some of the muscle aches that she was having as well.  Please send in Lipitor 20 mg daily if she is agreeable to start.  We should recheck this again in 3 to 6 months.  Vitamin D is low.  Recommend that she take at least 1000 to 2000 IUs once daily and we can recheck this in 3 to 6 months.  Her B12 is on the lower side of normal.  Recommend starting B12 supplement 1000 mcg daily and we can recheck in 3 to 6 months.  A1c is into the prediabetic range.  She should continue to work on diet and exercise and we can recheck this in a year or so.  Algis Greenhouse. [name], MD 01/01/2023 12:38 PM

## 2023-01-06 ENCOUNTER — Other Ambulatory Visit: Payer: Self-pay | Admitting: *Deleted

## 2023-01-06 MED ORDER — ATORVASTATIN CALCIUM 20 MG PO TABS: 20.0000 mg | ORAL_TABLET | Freq: Every day | ORAL | 3 refills | Status: DC

## 2023-03-08 DIAGNOSIS — C44722 Squamous cell carcinoma of skin of right lower limb, including hip: Secondary | ICD-10-CM | POA: Diagnosis not present

## 2023-03-08 DIAGNOSIS — D485 Neoplasm of uncertain behavior of skin: Secondary | ICD-10-CM | POA: Diagnosis not present

## 2023-03-08 DIAGNOSIS — Z85828 Personal history of other malignant neoplasm of skin: Secondary | ICD-10-CM | POA: Diagnosis not present

## 2023-03-08 DIAGNOSIS — L080 Pyoderma: Secondary | ICD-10-CM | POA: Diagnosis not present

## 2023-04-09 ENCOUNTER — Other Ambulatory Visit: Payer: Self-pay | Admitting: Family Medicine

## 2023-04-14 DIAGNOSIS — H353132 Nonexudative age-related macular degeneration, bilateral, intermediate dry stage: Secondary | ICD-10-CM | POA: Diagnosis not present

## 2023-05-10 DIAGNOSIS — D485 Neoplasm of uncertain behavior of skin: Secondary | ICD-10-CM | POA: Diagnosis not present

## 2023-05-10 DIAGNOSIS — C44729 Squamous cell carcinoma of skin of left lower limb, including hip: Secondary | ICD-10-CM | POA: Diagnosis not present

## 2023-05-10 DIAGNOSIS — C44722 Squamous cell carcinoma of skin of right lower limb, including hip: Secondary | ICD-10-CM | POA: Diagnosis not present

## 2023-05-10 DIAGNOSIS — L570 Actinic keratosis: Secondary | ICD-10-CM | POA: Diagnosis not present

## 2023-05-10 DIAGNOSIS — Z85828 Personal history of other malignant neoplasm of skin: Secondary | ICD-10-CM | POA: Diagnosis not present

## 2023-05-10 DIAGNOSIS — C44622 Squamous cell carcinoma of skin of right upper limb, including shoulder: Secondary | ICD-10-CM | POA: Diagnosis not present

## 2023-06-07 DIAGNOSIS — Z961 Presence of intraocular lens: Secondary | ICD-10-CM | POA: Diagnosis not present

## 2023-06-07 DIAGNOSIS — H353132 Nonexudative age-related macular degeneration, bilateral, intermediate dry stage: Secondary | ICD-10-CM | POA: Diagnosis not present

## 2023-06-07 DIAGNOSIS — H35363 Drusen (degenerative) of macula, bilateral: Secondary | ICD-10-CM | POA: Diagnosis not present

## 2023-06-07 DIAGNOSIS — H26492 Other secondary cataract, left eye: Secondary | ICD-10-CM | POA: Diagnosis not present

## 2023-06-14 DIAGNOSIS — Z85828 Personal history of other malignant neoplasm of skin: Secondary | ICD-10-CM | POA: Diagnosis not present

## 2023-06-14 DIAGNOSIS — C44729 Squamous cell carcinoma of skin of left lower limb, including hip: Secondary | ICD-10-CM | POA: Diagnosis not present

## 2023-06-14 DIAGNOSIS — L570 Actinic keratosis: Secondary | ICD-10-CM | POA: Diagnosis not present

## 2023-06-14 DIAGNOSIS — D485 Neoplasm of uncertain behavior of skin: Secondary | ICD-10-CM | POA: Diagnosis not present

## 2023-06-16 DIAGNOSIS — H26491 Other secondary cataract, right eye: Secondary | ICD-10-CM | POA: Diagnosis not present

## 2023-06-29 DIAGNOSIS — M25551 Pain in right hip: Secondary | ICD-10-CM | POA: Diagnosis not present

## 2023-07-26 DIAGNOSIS — H26491 Other secondary cataract, right eye: Secondary | ICD-10-CM | POA: Diagnosis not present

## 2023-07-28 DIAGNOSIS — Z85828 Personal history of other malignant neoplasm of skin: Secondary | ICD-10-CM | POA: Diagnosis not present

## 2023-07-28 DIAGNOSIS — L570 Actinic keratosis: Secondary | ICD-10-CM | POA: Diagnosis not present

## 2023-07-28 DIAGNOSIS — C44729 Squamous cell carcinoma of skin of left lower limb, including hip: Secondary | ICD-10-CM | POA: Diagnosis not present

## 2023-08-04 DIAGNOSIS — M79672 Pain in left foot: Secondary | ICD-10-CM | POA: Diagnosis not present

## 2023-08-04 DIAGNOSIS — L600 Ingrowing nail: Secondary | ICD-10-CM | POA: Diagnosis not present

## 2023-08-13 DIAGNOSIS — L600 Ingrowing nail: Secondary | ICD-10-CM | POA: Diagnosis not present

## 2023-09-09 DIAGNOSIS — Z85828 Personal history of other malignant neoplasm of skin: Secondary | ICD-10-CM | POA: Diagnosis not present

## 2023-09-09 DIAGNOSIS — C44329 Squamous cell carcinoma of skin of other parts of face: Secondary | ICD-10-CM | POA: Diagnosis not present

## 2023-09-09 DIAGNOSIS — C44729 Squamous cell carcinoma of skin of left lower limb, including hip: Secondary | ICD-10-CM | POA: Diagnosis not present

## 2023-09-09 DIAGNOSIS — D485 Neoplasm of uncertain behavior of skin: Secondary | ICD-10-CM | POA: Diagnosis not present

## 2023-09-09 DIAGNOSIS — L905 Scar conditions and fibrosis of skin: Secondary | ICD-10-CM | POA: Diagnosis not present

## 2023-09-20 ENCOUNTER — Ambulatory Visit (HOSPITAL_COMMUNITY)
Admission: RE | Admit: 2023-09-20 | Discharge: 2023-09-20 | Disposition: A | Discharge status: Home / Self Care | Payer: PPO | Source: Ambulatory Visit | Attending: Acute Care | Admitting: Acute Care

## 2023-09-20 DIAGNOSIS — Z87891 Personal history of nicotine dependence: Secondary | ICD-10-CM | POA: Insufficient documentation

## 2023-09-20 DIAGNOSIS — F17210 Nicotine dependence, cigarettes, uncomplicated: Secondary | ICD-10-CM | POA: Insufficient documentation

## 2023-09-20 DIAGNOSIS — Z122 Encounter for screening for malignant neoplasm of respiratory organs: Secondary | ICD-10-CM | POA: Diagnosis not present

## 2023-10-04 DIAGNOSIS — Z1231 Encounter for screening mammogram for malignant neoplasm of breast: Secondary | ICD-10-CM | POA: Diagnosis not present

## 2023-10-04 LAB — HM MAMMOGRAPHY

## 2023-10-05 ENCOUNTER — Encounter: Payer: Self-pay | Admitting: Family Medicine

## 2023-10-14 ENCOUNTER — Other Ambulatory Visit: Payer: Self-pay

## 2023-10-14 DIAGNOSIS — F17210 Nicotine dependence, cigarettes, uncomplicated: Secondary | ICD-10-CM

## 2023-10-14 DIAGNOSIS — Z87891 Personal history of nicotine dependence: Secondary | ICD-10-CM

## 2023-10-14 DIAGNOSIS — Z122 Encounter for screening for malignant neoplasm of respiratory organs: Secondary | ICD-10-CM

## 2023-10-19 DIAGNOSIS — C44729 Squamous cell carcinoma of skin of left lower limb, including hip: Secondary | ICD-10-CM | POA: Diagnosis not present

## 2023-10-19 DIAGNOSIS — D485 Neoplasm of uncertain behavior of skin: Secondary | ICD-10-CM | POA: Diagnosis not present

## 2023-10-19 DIAGNOSIS — L905 Scar conditions and fibrosis of skin: Secondary | ICD-10-CM | POA: Diagnosis not present

## 2023-10-19 DIAGNOSIS — Z85828 Personal history of other malignant neoplasm of skin: Secondary | ICD-10-CM | POA: Diagnosis not present

## 2023-10-19 DIAGNOSIS — L570 Actinic keratosis: Secondary | ICD-10-CM | POA: Diagnosis not present

## 2023-12-18 ENCOUNTER — Other Ambulatory Visit: Payer: Self-pay | Admitting: Family Medicine

## 2024-01-03 ENCOUNTER — Telehealth: Payer: Self-pay | Admitting: *Deleted

## 2024-01-03 ENCOUNTER — Encounter: Payer: PPO | Admitting: Family Medicine

## 2024-01-03 NOTE — Telephone Encounter (Signed)
 Copied from CRM [id number]. Topic: Clinical - Request for Lab/Test Order >> Jan 03, 2024  9:29 AM Deaijah H wrote: Reason for CRM: Patient would like to know if she can do her labs for her physical ahead of time.   Please advise  Ralene Gasparyan,RMA

## 2024-01-04 ENCOUNTER — Other Ambulatory Visit: Payer: Self-pay | Admitting: *Deleted

## 2024-01-04 DIAGNOSIS — L570 Actinic keratosis: Secondary | ICD-10-CM | POA: Diagnosis not present

## 2024-01-04 DIAGNOSIS — Z0000 Encounter for general adult medical examination without abnormal findings: Secondary | ICD-10-CM

## 2024-01-04 DIAGNOSIS — D0439 Carcinoma in situ of skin of other parts of face: Secondary | ICD-10-CM | POA: Diagnosis not present

## 2024-01-04 DIAGNOSIS — C44622 Squamous cell carcinoma of skin of right upper limb, including shoulder: Secondary | ICD-10-CM | POA: Diagnosis not present

## 2024-01-04 DIAGNOSIS — C44722 Squamous cell carcinoma of skin of right lower limb, including hip: Secondary | ICD-10-CM | POA: Diagnosis not present

## 2024-01-04 DIAGNOSIS — L821 Other seborrheic keratosis: Secondary | ICD-10-CM | POA: Diagnosis not present

## 2024-01-04 DIAGNOSIS — Z85828 Personal history of other malignant neoplasm of skin: Secondary | ICD-10-CM | POA: Diagnosis not present

## 2024-01-04 DIAGNOSIS — D485 Neoplasm of uncertain behavior of skin: Secondary | ICD-10-CM | POA: Diagnosis not present

## 2024-01-04 NOTE — Telephone Encounter (Signed)
 Please schedule a Lab appt prior to CPE  Future lab order

## 2024-01-04 NOTE — Telephone Encounter (Signed)
 Ok with me. Please place any necessary orders.

## 2024-02-02 ENCOUNTER — Other Ambulatory Visit

## 2024-02-09 ENCOUNTER — Telehealth: Payer: Self-pay | Admitting: *Deleted

## 2024-02-09 NOTE — Telephone Encounter (Signed)
 Copied from CRM [id number]. Topic: Referral - Request for Referral >> Feb 08, 2024  4:11 PM Truddie Crumble wrote: Patient called stating she is checking a message that the was sent earlier today regarding getting a referral  CB [phone] Cell-[phone]    Patient need OV for referral  See previews note [facility]

## 2024-02-09 NOTE — Telephone Encounter (Signed)
 Copied from CRM [id number]. Topic: Referral - Request for Referral >> Feb 08, 2024  9:28 AM Nyra Capes wrote: patient calling in asking for a referral, patient has hard cyst on back by bra strap area, patient would like a referral to a surgeon  for removal. Patient feels it's getting a little bigger.  Phone # [phone] ok to leave a detailed message.      Did the patient discuss referral with their provider in the last year? No (If No - schedule appointment) (If Yes - send message)  Appointment offered? No patient refused due to physical on 04/024/25  Type of order/referral and detailed reason for visit: cyst on back  Preference of office, provider, location: None  If referral order, have you been seen by this specialty before? No (If Yes, this issue or another issue? When? Where?  Can we respond through MyChart? No

## 2024-02-09 NOTE — Telephone Encounter (Signed)
 Left message to return call to our office at their convenience. To schedule an office visit  Please schedule an office visit (cyst on back) Last OV 12/2022

## 2024-02-11 ENCOUNTER — Encounter: Payer: Self-pay | Admitting: Family Medicine

## 2024-02-11 ENCOUNTER — Ambulatory Visit (INDEPENDENT_AMBULATORY_CARE_PROVIDER_SITE_OTHER): Admitting: Family Medicine

## 2024-02-11 VITALS — BP 137/73 | HR 83 | Temp 97.9°F | Ht 64.5 in | Wt 160.8 lb

## 2024-02-11 DIAGNOSIS — R739 Hyperglycemia, unspecified: Secondary | ICD-10-CM | POA: Diagnosis not present

## 2024-02-11 DIAGNOSIS — R7303 Prediabetes: Secondary | ICD-10-CM

## 2024-02-11 DIAGNOSIS — E039 Hypothyroidism, unspecified: Secondary | ICD-10-CM | POA: Diagnosis not present

## 2024-02-11 DIAGNOSIS — L723 Sebaceous cyst: Secondary | ICD-10-CM

## 2024-02-11 DIAGNOSIS — E538 Deficiency of other specified B group vitamins: Secondary | ICD-10-CM | POA: Diagnosis not present

## 2024-02-11 DIAGNOSIS — E785 Hyperlipidemia, unspecified: Secondary | ICD-10-CM

## 2024-02-11 DIAGNOSIS — E559 Vitamin D deficiency, unspecified: Secondary | ICD-10-CM | POA: Diagnosis not present

## 2024-02-11 LAB — COMPREHENSIVE METABOLIC PANEL WITH GFR
ALT: 15 U/L (ref 0–35)
AST: 15 U/L (ref 0–37)
Albumin: 4.4 g/dL (ref 3.5–5.2)
Alkaline Phosphatase: 82 U/L (ref 39–117)
BUN: 14 mg/dL (ref 6–23)
CO2: 29 meq/L (ref 19–32)
Calcium: 9.2 mg/dL (ref 8.4–10.5)
Chloride: 101 meq/L (ref 96–112)
Creatinine, Ser: 0.8 mg/dL (ref 0.40–1.20)
GFR: 70.77 mL/min (ref >60.00)
Glucose, Bld: 88 mg/dL (ref 70–99)
Potassium: 4.1 meq/L (ref 3.5–5.1)
Sodium: 137 meq/L (ref 135–145)
Total Bilirubin: 0.4 mg/dL (ref 0.2–1.2)
Total Protein: 6.9 g/dL (ref 6.0–8.3)

## 2024-02-11 LAB — HEMOGLOBIN A1C: Hgb A1c MFr Bld: 6.3 % (ref 4.6–6.5)

## 2024-02-11 LAB — CBC
HCT: 42.7 % (ref 36.0–46.0)
Hemoglobin: 14.1 g/dL (ref 12.0–15.0)
MCHC: 32.9 g/dL (ref 30.0–36.0)
MCV: 91.5 fl (ref 78.0–100.0)
Platelets: 260 10*3/uL (ref 150.0–400.0)
RBC: 4.66 Mil/uL (ref 3.87–5.11)
RDW: 13.9 % (ref 11.5–15.5)
WBC: 7.1 10*3/uL (ref 4.0–10.5)

## 2024-02-11 LAB — VITAMIN D 25 HYDROXY (VIT D DEFICIENCY, FRACTURES): VITD: 29.36 ng/mL — ABNORMAL LOW (ref 30.00–100.00)

## 2024-02-11 LAB — VITAMIN B12: Vitamin B-12: 224 pg/mL (ref 211–911)

## 2024-02-11 LAB — LIPID PANEL
Cholesterol: 211 mg/dL — ABNORMAL HIGH (ref 0–200)
HDL: 64.1 mg/dL (ref >39.00)
LDL Cholesterol: 137 mg/dL — ABNORMAL HIGH (ref 0–99)
NonHDL: 147.39
Total CHOL/HDL Ratio: 3
Triglycerides: 50 mg/dL (ref 0.0–149.0)
VLDL: 10 mg/dL (ref 0.0–40.0)

## 2024-02-11 LAB — TSH: TSH: 2.02 u[IU]/mL (ref 0.35–5.50)

## 2024-02-11 NOTE — Assessment & Plan Note (Signed)
On Synthroid 100 mcg daily.  Check TSH. 

## 2024-02-11 NOTE — Assessment & Plan Note (Signed)
 Check A1c.

## 2024-02-11 NOTE — Assessment & Plan Note (Signed)
 She stopped Lipitor due to concern for myalgias.  If elevated will need to restart alternative statin such as simvastatin versus Zetia.

## 2024-02-11 NOTE — Assessment & Plan Note (Signed)
 Check vitamin D.

## 2024-02-11 NOTE — Progress Notes (Signed)
   [name] is a 78 y.o. female who presents today for an office visit.  Assessment/Plan:  New/Acute Problems: Sebaceous Cyst  Mass on back consistent with sebaceous cyst.  She is having more pain to the area due to location and she would like to have it surgically removed.  Will place referral to surgery.  Chronic Problems Addressed Today: Hypothyroidism On Synthroid 100 mcg daily.  Check TSH.  Hyperlipidemia with target LDL less than 100 She stopped Lipitor due to concern for myalgias.  If elevated will need to restart alternative statin such as simvastatin versus Zetia.  B12 deficiency Check B12.  Vitamin D deficiency Check vitamin D.  Prediabetes Check A1c.     Subjective:  HPI:  See Assessment / plan  for status of chronic conditions. Her main concern today is cyst on her back. This has been present for several months. Getting worse. No treatments tried.        Objective:  Physical Exam: BP 137/73   Pulse 83   Temp 97.9 F (36.6 C) (Temporal)   Ht 5' 4.5" (1.638 m)   Wt 160 lb 12.8 oz (72.9 kg)   SpO2 96%   BMI 27.17 kg/m   Gen: No acute distress, resting comfortably Skin: Back with approximately 2 cm mass on mid thoracic spine.  Nontender to palpation.  No purulence or drainage. Neuro: Grossly normal, moves all extremities Psych: Normal affect and thought content      Myda Detwiler M. Jimmey Ralph, MD 02/11/2024 11:30 AM

## 2024-02-11 NOTE — Patient Instructions (Signed)
 It was very nice to see you today!  I will refer you to see the surgeon.  Will check blood work today.  Please continue work on diet and exercise.  I will see you back in year for your next physical.  Please come back to see [place] sooner if needed.  Return in about 1 year (around 02/10/2025) for Annual Physical.   Take care, Dr Jimmey Ralph  PLEASE NOTE:  If you had any lab tests, please let us know if you have not heard back within a few days. You may see your results on mychart before we have a chance to review them but we will give you a call once they are reviewed by [place].   If we ordered any referrals today, please let us know if you have not heard from their office within the next week.   If you had any urgent prescriptions sent in today, please check with the pharmacy within an hour of our visit to make sure the prescription was transmitted appropriately.   Please try these tips to maintain a healthy lifestyle:  Eat at least 3 REAL meals and 1-2 snacks per day.  Aim for no more than 5 hours between eating.  If you eat breakfast, please do so within one hour of getting up.   Each meal should contain half fruits/vegetables, one quarter protein, and one quarter carbs (no bigger than a computer mouse)  Cut down on sweet beverages. This includes juice, soda, and sweet tea.   Drink at least 1 glass of water with each meal and aim for at least 8 glasses per day  Exercise at least 150 minutes every week.

## 2024-02-11 NOTE — Assessment & Plan Note (Signed)
 Check B12

## 2024-02-15 ENCOUNTER — Encounter: Payer: Self-pay | Admitting: Family Medicine

## 2024-02-15 NOTE — Progress Notes (Signed)
 Cholesterol is elevated. Recommend starting a low dose statin such as simvastatin 20 mg daily if she is agreeable. This will improve her numbers and lower risk of heart attack and stroke.   Her A1c is borderline elevated. Do not need to start medications for this but she should work on diet and exercise and we can recheck in a year.   Vitamin D is low but better than last year. Recommend supplementation with at least 2000 IU daily and we can recheck in 3-6 months.

## 2024-02-17 NOTE — Progress Notes (Signed)
 Recommend referral to lipid clinic to discuss injectable cholesterol medications.

## 2024-02-24 ENCOUNTER — Encounter: Admitting: Family Medicine

## 2024-02-24 ENCOUNTER — Other Ambulatory Visit: Payer: Self-pay | Admitting: *Deleted

## 2024-02-24 ENCOUNTER — Ambulatory Visit: Payer: Self-pay | Admitting: General Surgery

## 2024-02-24 ENCOUNTER — Telehealth: Payer: Self-pay | Admitting: *Deleted

## 2024-02-24 DIAGNOSIS — E785 Hyperlipidemia, unspecified: Secondary | ICD-10-CM

## 2024-02-24 DIAGNOSIS — L723 Sebaceous cyst: Secondary | ICD-10-CM | POA: Diagnosis not present

## 2024-02-24 NOTE — Telephone Encounter (Signed)
 Copied from CRM [id number]. Topic: General - Other >> Feb 24, 2024 11:45 AM Allyne Areola wrote: Reason for CRM: Patient is returning a call she received from Bernell Brigham, [place] of the message that was sent and she states if Dr.Parker isn't able to request the medication then she is okay with being referred to the lipid clinic.    Referral send to lipid clinic  [facility]

## 2024-03-03 ENCOUNTER — Other Ambulatory Visit: Payer: Self-pay

## 2024-03-03 ENCOUNTER — Encounter (HOSPITAL_BASED_OUTPATIENT_CLINIC_OR_DEPARTMENT_OTHER): Payer: Self-pay | Admitting: General Surgery

## 2024-03-06 NOTE — Anesthesia Preprocedure Evaluation (Signed)
 Anesthesia Evaluation  Patient identified by MRN, date of birth, ID band Patient awake    Reviewed: Allergy & Precautions, NPO status , Patient's Chart, lab work & pertinent test results  Airway Mallampati: III  TM Distance: >3 FB Neck ROM: Full    Dental no notable dental hx. (+) Teeth Intact, Dental Advisory Given   Pulmonary Current Smoker and Patient abstained from smoking. 35 pack year history    Pulmonary exam normal breath sounds clear to auscultation       Cardiovascular + angina  + CAD (seen on CT chest)  Normal cardiovascular exam Rhythm:Regular Rate:Normal  Normal stress test 2019   Neuro/Psych  Headaches  negative psych ROS   GI/Hepatic negative GI ROS, Neg liver ROS,,,  Endo/Other  Hypothyroidism    Renal/GU negative Renal ROS  negative genitourinary   Musculoskeletal negative musculoskeletal ROS (+)    Abdominal   Peds  Hematology negative hematology ROS (+)   Anesthesia Other Findings   Reproductive/Obstetrics negative OB ROS                             Anesthesia Physical Anesthesia Plan  ASA: 3  Anesthesia Plan: General   Post-op Pain Management: Tylenol PO (pre-op)*   Induction: Intravenous  PONV Risk Score and Plan: 2 and Ondansetron, Dexamethasone and Treatment may vary due to age or medical condition  Airway Management Planned: Oral ETT  Additional Equipment: None  Intra-op Plan:   Post-operative Plan: Extubation in OR  Informed Consent: I have reviewed the patients History and Physical, chart, labs and discussed the procedure including the risks, benefits and alternatives for the proposed anesthesia with the patient or authorized representative who has indicated his/her understanding and acceptance.     Dental advisory given  Plan Discussed with: CRNA  Anesthesia Plan Comments: (Surgeon changed procedure to local only. )       Anesthesia  Quick Evaluation

## 2024-03-07 ENCOUNTER — Ambulatory Visit (HOSPITAL_BASED_OUTPATIENT_CLINIC_OR_DEPARTMENT_OTHER): Payer: Self-pay | Admitting: Anesthesiology

## 2024-03-07 ENCOUNTER — Encounter (HOSPITAL_BASED_OUTPATIENT_CLINIC_OR_DEPARTMENT_OTHER)
Admission: RE | Disposition: A | Discharge status: Home / Self Care | Payer: Self-pay | Source: Home / Self Care | Attending: General Surgery

## 2024-03-07 ENCOUNTER — Ambulatory Visit (HOSPITAL_COMMUNITY)
Admission: RE | Admit: 2024-03-07 | Discharge: 2024-03-07 | Disposition: A | Discharge status: Home / Self Care | Attending: General Surgery | Admitting: General Surgery

## 2024-03-07 ENCOUNTER — Other Ambulatory Visit: Payer: Self-pay

## 2024-03-07 ENCOUNTER — Encounter (HOSPITAL_BASED_OUTPATIENT_CLINIC_OR_DEPARTMENT_OTHER): Payer: Self-pay | Admitting: General Surgery

## 2024-03-07 DIAGNOSIS — E039 Hypothyroidism, unspecified: Secondary | ICD-10-CM | POA: Diagnosis not present

## 2024-03-07 DIAGNOSIS — I2510 Atherosclerotic heart disease of native coronary artery without angina pectoris: Secondary | ICD-10-CM | POA: Diagnosis not present

## 2024-03-07 DIAGNOSIS — L723 Sebaceous cyst: Secondary | ICD-10-CM | POA: Diagnosis present

## 2024-03-07 DIAGNOSIS — L720 Epidermal cyst: Secondary | ICD-10-CM | POA: Insufficient documentation

## 2024-03-07 DIAGNOSIS — Z8249 Family history of ischemic heart disease and other diseases of the circulatory system: Secondary | ICD-10-CM | POA: Insufficient documentation

## 2024-03-07 DIAGNOSIS — F17210 Nicotine dependence, cigarettes, uncomplicated: Secondary | ICD-10-CM | POA: Insufficient documentation

## 2024-03-07 DIAGNOSIS — Z01818 Encounter for other preprocedural examination: Secondary | ICD-10-CM

## 2024-03-07 HISTORY — PX: EXCISION OF BACK LESION: SHX6597

## 2024-03-07 SURGERY — EXCISION, LESION, BACK
Anesthesia: General

## 2024-03-07 MED ORDER — ACETAMINOPHEN 500 MG PO TABS: 1000.0000 mg | ORAL_TABLET | ORAL | Status: DC

## 2024-03-07 MED ORDER — DEXAMETHASONE SODIUM PHOSPHATE 10 MG/ML IJ SOLN
INTRAMUSCULAR | Status: AC
  Filled 2024-03-07: qty 1

## 2024-03-07 MED ORDER — PROPOFOL 10 MG/ML IV BOLUS
INTRAVENOUS | Status: AC
  Filled 2024-03-07: qty 20

## 2024-03-07 MED ORDER — ACETAMINOPHEN 500 MG PO TABS
ORAL_TABLET | ORAL | Status: AC
  Filled 2024-03-07: qty 2

## 2024-03-07 MED ORDER — ACETAMINOPHEN 325 MG PO TABS
650.0000 mg | ORAL_TABLET | Freq: Four times a day (QID) | ORAL | 0 refills | Status: AC
Start: 2024-03-07 — End: 2024-03-13

## 2024-03-07 MED ORDER — CEFAZOLIN SODIUM-DEXTROSE 2-4 GM/100ML-% IV SOLN
INTRAVENOUS | Status: AC
  Filled 2024-03-07: qty 100

## 2024-03-07 MED ORDER — CEFAZOLIN SODIUM-DEXTROSE 2-4 GM/100ML-% IV SOLN: 2.0000 g | INTRAVENOUS | Status: DC

## 2024-03-07 MED ORDER — MIDAZOLAM HCL 2 MG/2ML IJ SOLN
INTRAMUSCULAR | Status: AC
  Filled 2024-03-07: qty 2

## 2024-03-07 MED ORDER — ACETAMINOPHEN 500 MG PO TABS: 1000.0000 mg | ORAL_TABLET | Freq: Once | ORAL | Status: DC

## 2024-03-07 MED ORDER — LIDOCAINE-EPINEPHRINE (PF) 1 %-1:200000 IJ SOLN
INTRAMUSCULAR | Status: DC | PRN
  Administered 2024-03-07: 18 mL

## 2024-03-07 MED ORDER — FENTANYL CITRATE (PF) 100 MCG/2ML IJ SOLN
INTRAMUSCULAR | Status: AC
  Filled 2024-03-07: qty 2

## 2024-03-07 MED ORDER — CHLORHEXIDINE GLUCONATE CLOTH 2 % EX PADS: 6.0000 | MEDICATED_PAD | Freq: Once | CUTANEOUS | Status: DC

## 2024-03-07 MED ORDER — LIDOCAINE-EPINEPHRINE (PF) 1 %-1:200000 IJ SOLN
INTRAMUSCULAR | Status: AC
  Filled 2024-03-07: qty 30

## 2024-03-07 MED ORDER — LACTATED RINGERS IV SOLN: INTRAVENOUS | Status: DC

## 2024-03-07 MED ORDER — IBUPROFEN 200 MG PO TABS
600.0000 mg | ORAL_TABLET | Freq: Four times a day (QID) | ORAL | 0 refills | Status: AC
Start: 2024-03-07 — End: 2024-03-13

## 2024-03-07 MED ORDER — LIDOCAINE 2% (20 MG/ML) 5 ML SYRINGE
INTRAMUSCULAR | Status: AC
  Filled 2024-03-07: qty 5

## 2024-03-07 MED ORDER — ACETAMINOPHEN 500 MG PO TABS
1000.0000 mg | ORAL_TABLET | Freq: Once | ORAL | Status: AC
  Administered 2024-03-07: 1000 mg via ORAL

## 2024-03-07 MED ORDER — LIDOCAINE-EPINEPHRINE 1 %-1:100000 IJ SOLN
INTRAMUSCULAR | Status: AC
  Filled 2024-03-07: qty 1

## 2024-03-07 MED ORDER — OXYCODONE HCL 5 MG PO TABS: 5.0000 mg | ORAL_TABLET | Freq: Three times a day (TID) | ORAL | 0 refills | Status: DC | PRN

## 2024-03-07 MED ORDER — GABAPENTIN 300 MG PO CAPS: 300.0000 mg | ORAL_CAPSULE | ORAL | Status: DC

## 2024-03-07 MED ORDER — ONDANSETRON HCL 4 MG/2ML IJ SOLN
INTRAMUSCULAR | Status: AC
  Filled 2024-03-07: qty 2

## 2024-03-07 SURGICAL SUPPLY — 37 items
BENZOIN TINCTURE PRP APPL 2/3 (GAUZE/BANDAGES/DRESSINGS) IMPLANT
BLADE CLIPPER SURG (BLADE) IMPLANT
BLADE SURG 10 STRL SS (BLADE) IMPLANT
BLADE SURG 15 STRL LF DISP TIS (BLADE) ×1 IMPLANT
BNDG ELASTIC 4INX 5YD STR LF (GAUZE/BANDAGES/DRESSINGS) IMPLANT
CANISTER SUCT 1200ML W/VALVE (MISCELLANEOUS) IMPLANT
CHLORAPREP W/TINT 26 (MISCELLANEOUS) ×1 IMPLANT
COVER BACK TABLE 60X90IN (DRAPES) ×1 IMPLANT
COVER MAYO STAND STRL (DRAPES) ×1 IMPLANT
DERMABOND ADVANCED .7 DNX12 (GAUZE/BANDAGES/DRESSINGS) IMPLANT
DRAPE LAPAROTOMY 100X72 PEDS (DRAPES) ×1 IMPLANT
DRAPE UTILITY XL STRL (DRAPES) ×1 IMPLANT
ELECT COATED BLADE 2.86 ST (ELECTRODE) ×1 IMPLANT
ELECTRODE REM PT RTRN 9FT ADLT (ELECTROSURGICAL) ×1 IMPLANT
GLOVE BIO SURGEON STRL SZ7 (GLOVE) ×1 IMPLANT
GLOVE BIOGEL PI IND STRL 7.5 (GLOVE) ×1 IMPLANT
GOWN STRL REUS W/ TWL LRG LVL3 (GOWN DISPOSABLE) ×1 IMPLANT
GOWN STRL REUS W/ TWL XL LVL3 (GOWN DISPOSABLE) ×1 IMPLANT
NDL HYPO 25X1 1.5 SAFETY (NEEDLE) ×1 IMPLANT
NEEDLE HYPO 25X1 1.5 SAFETY (NEEDLE) ×1 IMPLANT
NS IRRIG 1000ML POUR BTL (IV SOLUTION) IMPLANT
PACK BASIN DAY SURGERY FS (CUSTOM PROCEDURE TRAY) ×1 IMPLANT
PENCIL SMOKE EVACUATOR (MISCELLANEOUS) ×1 IMPLANT
SLEEVE SCD COMPRESS KNEE MED (STOCKING) ×1 IMPLANT
SPIKE FLUID TRANSFER (MISCELLANEOUS) IMPLANT
SPONGE T-LAP 4X18 ~~LOC~~+RFID (SPONGE) IMPLANT
STAPLER SKIN PROX WIDE 3.9 (STAPLE) IMPLANT
STRIP CLOSURE SKIN 1/2X4 (GAUZE/BANDAGES/DRESSINGS) IMPLANT
SUT MNCRL AB 4-0 PS2 18 (SUTURE) IMPLANT
SUT MON AB 4-0 PC3 18 (SUTURE) ×1 IMPLANT
SUT VIC AB 3-0 SH 27X BRD (SUTURE) IMPLANT
SUT VICRYL 3-0 CR8 SH (SUTURE) IMPLANT
SUT VICRYL AB 3 0 TIES (SUTURE) IMPLANT
SYR CONTROL 10ML LL (SYRINGE) ×1 IMPLANT
TOWEL GREEN STERILE FF (TOWEL DISPOSABLE) ×2 IMPLANT
TUBE CONNECTING 20X1/4 (TUBING) IMPLANT
YANKAUER SUCT BULB TIP NO VENT (SUCTIONS) IMPLANT

## 2024-03-07 NOTE — H&P (Signed)
     [name] 1946/03/09  [id number].    HPI:  78 y/o F who presents for elective excision of a symptomatic subcutaneous back mass. She reports that she is in her usual state of health and denies any recent changes in medication.  ROS: Review of Systems  Constitutional: Negative.   HENT: Negative.    Eyes: Negative.   Respiratory: Negative.    Cardiovascular: Negative.   Gastrointestinal: Negative.   Genitourinary: Negative.   Musculoskeletal: Negative.   Skin:        Back mass  Neurological: Negative.   Endo/Heme/Allergies: Negative.   Psychiatric/Behavioral: Negative.      Family History  Problem Relation Age of Onset   Cancer Maternal Aunt        breast   Cancer Paternal Aunt        breast   Cancer Maternal Grandmother        breast   Cancer Paternal Grandmother        breast   Cancer Mother 51       Kidney   Atrial fibrillation Father    Heart attack Father 15    Past Medical History:  Diagnosis Date   Cataract    DIVERTICULITIS, HX OF 11/13/2010   HYPOTHYROIDISM 11/13/2010   MIGRAINE HEADACHE 11/13/2010   SKIN CANCER, HX OF 11/13/2010   TOBACCO ABUSE 11/13/2010    Past Surgical History:  Procedure Laterality Date   NM MYOVIEW  LTD  05/2018    EF~55%. Normal BP response.  No EKG change.  LOW RISK. No Ischemia or Infarction.  (Mild Breast Attenuation)   THYROIDECTOMY  2004    Social History:  reports that she has been smoking cigarettes. She has a 35 pack-year smoking history. She has never used smokeless tobacco. She reports that she does not drink alcohol and does not use drugs.  Allergies:  Allergies  Allergen Reactions   Codeine Sulfate     REACTION: gi upset    Medications Prior to Admission  Medication Sig Dispense Refill   SYNTHROID  100 MCG tablet TAKE 1 TABLET BY MOUTH EVERY DAY BEFORE BREAKFAST 90 tablet 3    Physical Exam: Blood pressure (!) 150/79, pulse 66, temperature (!) 97.5 F (36.4 C), temperature source Temporal, resp.  rate 16, height 5\' 5"  (1.651 m), weight 71.8 kg, SpO2 98%. Gen: female, NAD Back: subcutaneous mass marked with patient in agreement  No results found for this or any previous visit (from the past 48 hours). No results found.  Assessment/Plan 78 y/o F w/ a symptomatic back mass  - Will proceed to the OR. We discussed the alternatives and potential risks of surgery, including but not limited to: bleeding, infection, damage to surrounding structures, recurrence, wound complications, and need for additional procedures. All questions were addressed and consent was obtained.    Trula Gable Surgery 03/07/2024, 10:26 AM Please see Amion for pager number during day hours 7:00am-4:30pm or 7:00am -11:30am on weekends

## 2024-03-07 NOTE — Op Note (Addendum)
 03/07/2024  12:17 PM  PATIENT:  [name]  78 y.o. female  Patient Care Team: Rodney Clamp, MD as PCP - General (Family Medicine) Arleen Lacer, MD as PCP - Cardiology (Cardiology)  PRE-OPERATIVE DIAGNOSIS:  Subcutaneous cyst  POST-OPERATIVE DIAGNOSIS:  Same  PROCEDURE:  Excision of subcutaneous cyst (2cm x 2cm)  SURGEON:  Cannon Champion, MD  ASSISTANT: None  ANESTHESIA:   local  COUNTS:  Sponge, needle and instrument counts were reported correct x2 at the conclusion of the operation.  EBL: Minimal  DRAINS: None  SPECIMEN: None  COMPLICATIONS: None  FINDINGS: Ruptured epidermoid cyst  DISPOSITION: PACU in satisfactory condition  INDICATION: [name] is a 78 yo F who developed an infected cyst on her back. The infection resolved with antibiotics but there was still a palpable cyst below the skin. I offered excision in the OR to help prevent recurrent infection. All of her questions were addressed and written consent was obtained  DESCRIPTION: The patient was identified in preop holding and taken to the OR where she was placed on the operating room table. SCDs were placed. The procedure was performed without anesthesia. Her back was then prepped and draped in the usual sterile fashion. A surgical timeout was performed indicating the correct patient, procedure, positioning and need for preoperative antibiotics.  The previously marked area on the back was identified. The area was injected with 1% lidocaine with epinephrine. A incision was made overlying the mass using a #15 blade and carried down through the subcutaneous tissue using electrocautery. It appeared that there was a cyst that had ruptured.  I identified the fragmented cyst wall and freed it from surrounding tissues using blunt dissection and electrocautery. The cyst measured approximately 2cm x 2cm. The wound was irrigated with sterile saline and inspected for hemostasis. Interrupted 3-0 vicryl were used to  approximate the dermis followed by a running 4-0 subcuticular monocryl. A layer of dermabond was applied.

## 2024-03-07 NOTE — Discharge Instructions (Signed)
 Outpatient Surgery Home Care Instruction  Activity  The effects of anesthesia are still present and drowsiness may result.  Limit activity for the first 24 hours, then you may return to normal daily activities. Returning to normal daily activities as soon as you can following surgery will enhance recovery time.  Do not drive or operate heavy machinery within 24 hours of taking narcotic pain medications.   Do not mow the lawn, use a vacuum cleaner, or do any other strenuous activities without first consulting your surgical team.   Diet Drink plenty of fluids and eat light meals today, then resume regular diet. Some patients may find their appetite is poor for a week or two after surgery. This is a normal result of the stress of surgery-your appetite will return in time.   There are no specific diet restrictions after surgery.   Dressing and Wound Care  Keep your wound or incision site clean and dry.  You may have different types of dressings covering your incisions depending on your operation and your surgeon: Dermabond/Durabond (skin glue): This will usually remain in place for 10-14 days, then naturally fall off your skin. You may take a shower 24 hrs after surgery, carefully wash, not scrub the incision site with a mild non-scented soap. Pat dry with a soft towel.  Do not pick or peel skin glue off.  You can shower and let the water fall on the dressings above. Do not soak or submerge your incision(s) in a bath tub, hot tub, or swimming pool, until your doctor says it is ok to do so or the incision(s) have completely healed, usually about 2-4 weeks.  Do not use creams, powder, salves or balms on your incision(s).  What to Expect After Surgery   Moderate discomfort controlled with medications  Minimal drainage from incision  Feeling fatigue and weak  Constipation after surgery is common. Drink plenty fluids and eat a high fiber diet.   Pain Control: Prescribed Non-Narcotic Pain  Medication  You will be given three prescriptions.  Two of them will be for prescription strength ibuprofen (i.e. Advil) and prescription strength acetaminophen  (i.e. Tylenol ).  The vast majority of patients will just need these two medications.  One prescription will be for a 'rescue' prescription of an oral narcotic (oxycodone ).  You may fill this if needed.  You will alternate taking the ibuprofen (600mg ) every 6 hours and also the acetaminophen  (650mg ) every 6 hours so that you are taking one of those medications every 3 hours.  For example: o 0800 - take ibuprofen 600mg  o 1100 - take acetaminophen  650mg  o 1400 - take ibuprofen 600mg  o 1700 - take acetaminophen  650mg  o Etc.  Continue taking this alternating pattern of ibuprofen and acetaminophen  for 3 days  If you cannot take one or the other of these medications, just take the one you can every 6 hours.  If you are comfortable at night, you don't have to wake up and take a medication.  If you are still uncomfortable after taking either ibuprofen or acetaminophen , try gentle stretching exercise and ice packs (a bag of frozen vegetables works great).  If you are still uncomfortable, you may fill the narcotic prescription of Oxycodone  and take as directed.  Once you have completed these prescriptions, your pain level should be low enough to stop taking medications altogether or just use an over the counter medication (ibuprofen or acetaminophen ) as needed.    Pain Control: Over the Counter Medications to take as needed  Colace/Docusate: May be prescribed by your surgeon to prevent constipation caused by the combination of narcotics, effects of anesthesia, and decreased ambulation.  Hold for loose stools or diarrhea. Take 100 mg 1-2 times a day starting tonight.   Fiber: High fiber foods, extra liquids (water 9-13 cups/day) can also assist with constipation. Examples of high fiber foods are fruit, bran. Prune juice and water are also good liquids  to drink.  Milk of Magnesia/Miralax:  If constipated despite takeing the over the counter stool softeners, you may take Milk of Magnesia or Miralax as directed on bottle to assist with constipation.     Pepcid/Famotidine: May be prescribed while taking naproxen (Aleve) or other NSAIDs such as ibuprofen (Motrin/Advil) to prevent stomach upset or Acid-reflux symptoms. Take 1 tablet 1-2 times a day.   **Constipation: The first bowel movement may occur anywhere between 1-5 days after surgery.  As long as you are not nauseated or not having significant abdominal pain this variation is acceptable. Narcotic pain medications can cause constipation increasing discomfort; early discontinuation will assist with bowel management. If constipated despite taking stool softeners, you may take Milk of Magnesia or Miralax as directed on the bottle.     **Home medications: You may restart your home medications as directed by your respective Primary Care Physician or Surgeon.   When to notify your Doctor or Healthcare Team   Sign of Wound Infection   Fever over 100 degrees.  Wound becomes extremely swollen, shows red streaks, warm to the touch, and/or drainage from the incision site or foul-smelling drainage.  Wound edges separate or opens up  Bleeding or bruising   If you have bleeding, apply pressure to the site and hold the pressure firmly for 5 minutes. If the bleeding continues, apply pressure again and call 911. If the bleeding stopped, call your doctor to report it.   Call your doctor or nurse if you have increased bleeding from your site and increased bruising or a lump forms or gets larger under your skin at the site. Unrelieved Pain   Call your doctor or nurse if your pain gets worse or is not eased 1 hour after taking your pain medicine, or if it is severe and uncontrolled. Nausea and Vomiting   Call your doctor or nurse if you have nausea and vomiting that continues more than 24 hours, will not let you  keep medicine down and will not let you keep fluids down  Fever, Flu-like symptoms   Fever over 100 degrees and/or chills  Gastrointestinal Bleeding Symptoms    Black tarry bowel movements.  This can be normal after surgery on the stomach, but should resolve in a day or two.    Call 911 if you suddenly have signs of blood loss such as:  Vomiting blood  Fast heart rate  Feeling faint, sweaty, or blacking out  Passing bright red blood from your rectum  Blood Clot Symptoms   Tender, swollen or reddened areas in your calf muscle or thighs.  Numbness or tingling in your lower leg or calf, or at the top of your leg or groin  Skin on your leg looks pale or blue or feels cold to touch  Chest pain or have trouble breathing, lightheadedness, fast heart rate  Sudden Onset of Symptoms    Call 911 if you suddenly have:  Leg weakness and spasm  Loss of bladder or bowel function  Seizure  Confusion, severe headache, dizziness or feeling unsteady, problems talking, difficulty swallowing, and/or  numbness or muscle weakness as these could be signs of a stroke.  Follow up Appointment Your follow up appointment should be scheduled 2-3 weeks after your surgery date.  If you have not previously scheduled for a follow-up visit you can be scheduled by contacting [phone].

## 2024-03-08 ENCOUNTER — Encounter (HOSPITAL_BASED_OUTPATIENT_CLINIC_OR_DEPARTMENT_OTHER): Payer: Self-pay | Admitting: General Surgery

## 2024-03-25 ENCOUNTER — Other Ambulatory Visit: Payer: Self-pay

## 2024-03-25 ENCOUNTER — Encounter (HOSPITAL_BASED_OUTPATIENT_CLINIC_OR_DEPARTMENT_OTHER): Payer: Self-pay | Admitting: Emergency Medicine

## 2024-03-25 ENCOUNTER — Emergency Department (HOSPITAL_BASED_OUTPATIENT_CLINIC_OR_DEPARTMENT_OTHER)
Admission: EM | Admit: 2024-03-25 | Discharge: 2024-03-25 | Disposition: A | Discharge status: Home / Self Care | Attending: Emergency Medicine | Admitting: Emergency Medicine

## 2024-03-25 DIAGNOSIS — Z79899 Other long term (current) drug therapy: Secondary | ICD-10-CM | POA: Insufficient documentation

## 2024-03-25 DIAGNOSIS — Z23 Encounter for immunization: Secondary | ICD-10-CM | POA: Insufficient documentation

## 2024-03-25 DIAGNOSIS — E039 Hypothyroidism, unspecified: Secondary | ICD-10-CM | POA: Insufficient documentation

## 2024-03-25 DIAGNOSIS — S51011A Laceration without foreign body of right elbow, initial encounter: Secondary | ICD-10-CM | POA: Diagnosis not present

## 2024-03-25 DIAGNOSIS — W268XXA Contact with other sharp object(s), not elsewhere classified, initial encounter: Secondary | ICD-10-CM | POA: Diagnosis not present

## 2024-03-25 DIAGNOSIS — S59901A Unspecified injury of right elbow, initial encounter: Secondary | ICD-10-CM | POA: Diagnosis present

## 2024-03-25 DIAGNOSIS — Z85828 Personal history of other malignant neoplasm of skin: Secondary | ICD-10-CM | POA: Insufficient documentation

## 2024-03-25 DIAGNOSIS — L03115 Cellulitis of right lower limb: Secondary | ICD-10-CM | POA: Diagnosis not present

## 2024-03-25 MED ORDER — CEPHALEXIN 500 MG PO CAPS: 500.0000 mg | ORAL_CAPSULE | Freq: Four times a day (QID) | ORAL | 0 refills | Status: DC

## 2024-03-25 MED ORDER — CEPHALEXIN 250 MG PO CAPS
500.0000 mg | ORAL_CAPSULE | Freq: Once | ORAL | Status: AC
  Administered 2024-03-25: 500 mg via ORAL
  Filled 2024-03-25: qty 2

## 2024-03-25 MED ORDER — TETANUS-DIPHTH-ACELL PERTUSSIS 5-2.5-18.5 LF-MCG/0.5 IM SUSY
0.5000 mL | PREFILLED_SYRINGE | Freq: Once | INTRAMUSCULAR | Status: AC
  Administered 2024-03-25: 0.5 mL via INTRAMUSCULAR
  Filled 2024-03-25: qty 0.5

## 2024-03-25 NOTE — ED Provider Notes (Signed)
 [place] EMERGENCY DEPARTMENT AT [facility] Provider Note   CSN: [id number] Arrival date & time: 03/25/24  1807     History  Chief Complaint  Patient presents with   Wound Infection    [name] is a 78 y.o. female.  Patient with history of skin cancer, diverticulitis, hypothyroidism -- presents to the emergency department for evaluation of skin tear to the right posterior calf.  This was sustained 3 days ago while she was at the beach.  She cut it on a beach chair.  Patient has had some mild increasing redness and tenderness around the area.  She also sustained a superficial laceration to the left posterior calf but this has been stable.  Patient has not had a tetanus booster and this is one of the main reasons why she came in today.  Also concerned about infection.  She denies fever, vomiting.  No streaking of redness up the leg.       Home Medications Prior to Admission medications   Medication Sig Start Date End Date Taking? Authorizing Provider  cephALEXin (KEFLEX) 500 MG capsule Take 1 capsule (500 mg total) by mouth 4 (four) times daily. 03/25/24  Yes Lyna Sandhoff, PA-C  oxyCODONE  (ROXICODONE ) 5 MG immediate release tablet Take 1 tablet (5 mg total) by mouth every 8 (eight) hours as needed. 03/07/24 03/07/25  Cannon Champion, MD  SYNTHROID  100 MCG tablet TAKE 1 TABLET BY MOUTH EVERY DAY BEFORE BREAKFAST 12/20/23   Rodney Clamp, MD      Allergies    Codeine sulfate    Review of Systems   Review of Systems  Physical Exam Updated Vital Signs BP (!) 179/85   Pulse 68   Temp 97.7 F (36.5 C) (Oral)   Resp 16   Wt 71.7 kg   SpO2 98%   BMI 26.29 kg/m   Physical Exam Vitals and nursing note reviewed.  Constitutional:      Appearance: She is well-developed.  HENT:     Head: Normocephalic and atraumatic.  Eyes:     Conjunctiva/sclera: Conjunctivae normal.  Pulmonary:     Effort: No respiratory distress.  Musculoskeletal:     Cervical back:  Normal range of motion and neck supple.  Skin:    General: Skin is warm and dry.     Comments: Right lower extremity: There is a nickel sized skin tear noted to the right posterior calf with mild surrounding erythema.  No active drainage.  No lymphangitis.  Area is mildly tender to palpation.  Left lower extremity: There is a 2 cm superficial laceration with skin intact, well-approximated, to the posterior calf.  Mild inflammatory changes adjacent to the wound but no signs of cellulitis.  Neurological:     Mental Status: She is alert.     ED Results / Procedures / Treatments   Labs (all labs ordered are listed, but only abnormal results are displayed) Labs Reviewed - No data to display  EKG None  Radiology No results found.  Procedures Procedures    Medications Ordered in ED Medications  Tdap (BOOSTRIX) injection 0.5 mL (has no administration in time range)  cephALEXin (KEFLEX) capsule 500 mg (has no administration in time range)    ED Course/ Medical Decision Making/ A&P    Patient seen and examined. History obtained directly from patient.   Labs/EKG: None ordered.  Imaging: None ordered.   Medications/Fluids: Tdap, Keflex  Most recent vital signs reviewed and are as follows: BP (!) 179/85  Pulse 68   Temp 97.7 F (36.5 C) (Oral)   Resp 16   Wt 71.7 kg   SpO2 98%   BMI 26.29 kg/m   Initial impression: Patient with small skin tear, mild cellulitis.  Home treatment plan: Wound care, elevation  Return instructions discussed with patient: Pt urged to return with worsening pain, worsening swelling, expanding area of redness or streaking up extremity, fever, or any other concerns. Pt verbalizes understanding and agrees with plan.  Follow-up instructions discussed with patient: Follow-up with PCP in 72 hours for recheck if not improving.                                Medical Decision Making Risk Prescription drug management.   Patient with skin tear to  right posterior calf with mild cellulitis.  No systemic symptoms.  No history of diabetes.  Would likely benefit from course of Keflex.  Tdap updated.  Patient with a small superficial laceration to left posterior calf not requiring other repair.        Final Clinical Impression(s) / ED Diagnoses Final diagnoses:  Skin tear of right elbow without complication, initial encounter  Cellulitis of right lower extremity    Rx / DC Orders ED Discharge Orders          Ordered    cephALEXin (KEFLEX) 500 MG capsule  4 times daily        03/25/24 1835              Kenya Shiraishi, PA-C 03/25/24 1844    Floyd, Dan, DO 03/25/24 1847

## 2024-03-25 NOTE — ED Notes (Signed)
Dressing placed on wound. Pt tolerated well.

## 2024-03-25 NOTE — ED Triage Notes (Signed)
 Right posterior lower leg scraped on a beach chair on Wednesday. Today is red/inflamed, partial granulation . Tetanus unsure. No fevers. No DM

## 2024-03-25 NOTE — Discharge Instructions (Signed)
 Please read and follow all provided instructions.  Your diagnoses today include:  1. Skin tear of right elbow without complication, initial encounter   2. Cellulitis of right lower extremity     Tests performed today include: Vital signs. See below for your results today.   Medications prescribed:  Keflex (cephalexin) - antibiotic  You have been prescribed an antibiotic medicine: take the entire course of medicine even if you are feeling better. Stopping early can cause the antibiotic not to work.  Take any prescribed medications only as directed.   Home care instructions:  Follow any educational materials contained in this packet. Keep affected area above the level of your heart when possible. Wash area gently twice a day with warm soapy water. Do not apply alcohol or hydrogen peroxide. Cover the area if it draining or weeping.   Follow-up instructions: See your doctor for recheck in 72 hours if not improving.  Return instructions:  Return to the Emergency Department if you have: Fever Worsening symptoms Worsening pain Worsening swelling Redness of the skin that moves away from the affected area, especially if it streaks away from the affected area  Any other emergent concerns  Your vital signs today were: BP (!) 179/85   Pulse 68   Temp 97.7 F (36.5 C) (Oral)   Resp 16   Wt 71.7 kg   SpO2 98%   BMI 26.29 kg/m  If your blood pressure (BP) was elevated above 135/85 this visit, please have this repeated by your doctor within one month. --------------

## 2024-04-17 DIAGNOSIS — Z85828 Personal history of other malignant neoplasm of skin: Secondary | ICD-10-CM | POA: Diagnosis not present

## 2024-04-17 DIAGNOSIS — L905 Scar conditions and fibrosis of skin: Secondary | ICD-10-CM | POA: Diagnosis not present

## 2024-04-17 DIAGNOSIS — L570 Actinic keratosis: Secondary | ICD-10-CM | POA: Diagnosis not present

## 2024-04-17 DIAGNOSIS — D224 Melanocytic nevi of scalp and neck: Secondary | ICD-10-CM | POA: Diagnosis not present

## 2024-04-17 DIAGNOSIS — L821 Other seborrheic keratosis: Secondary | ICD-10-CM | POA: Diagnosis not present

## 2024-05-01 ENCOUNTER — Ambulatory Visit (HOSPITAL_BASED_OUTPATIENT_CLINIC_OR_DEPARTMENT_OTHER): Admitting: Internal Medicine

## 2024-05-01 VITALS — BP 124/84 | HR 66 | Ht 65.5 in | Wt 162.4 lb

## 2024-05-01 DIAGNOSIS — Z720 Tobacco use: Secondary | ICD-10-CM | POA: Diagnosis not present

## 2024-05-01 DIAGNOSIS — I700 Atherosclerosis of aorta: Secondary | ICD-10-CM | POA: Diagnosis not present

## 2024-05-01 DIAGNOSIS — E785 Hyperlipidemia, unspecified: Secondary | ICD-10-CM

## 2024-05-01 DIAGNOSIS — I25119 Atherosclerotic heart disease of native coronary artery with unspecified angina pectoris: Secondary | ICD-10-CM

## 2024-05-01 MED ORDER — ROSUVASTATIN CALCIUM 20 MG PO TABS: 20.0000 mg | ORAL_TABLET | Freq: Every day | ORAL | 3 refills | Status: AC

## 2024-05-01 NOTE — Progress Notes (Signed)
 LIPID CLINIC CONSULT NOTE  Chief Complaint:  Manage dyslipidemia  Primary Care Physician: Kennyth Worth HERO, MD  Primary Cardiologist:  Alm Clay, MD  HPI:  [name] is a 78 y.o. female who is being seen today for the evaluation of dyslipidemia at the request of Kennyth Worth HERO, MD. This is a pleasant 78 year old female kindly referred for evaluation management of dyslipidemia.  She had previously seen Dr. Clay in 2019 however is considered a new patient since she has not seen heart care in many years.  She was referred for lipid management by her PCP.  She was previously on atorvastatin  with an LDL in the 80s however noted she had worsening joint and muscle pains on that medication and since stopping it has felt better.  She has not tried other statins.  She was initially recommended to target her LDL less than 100 although actually she should be much lower than that less than 70.  This is based on the fact that she has had a number of prior CT scans for lung cancer screening.  She is an active smoker with a 50-pack-year history.  This demonstrated aortic atherosclerosis and coronary artery atherosclerosis as well as emphysema but no evidence of any mass or nodule.  She previously had stress testing back in 2019 which was negative for ischemia.  She denies any anginal symptoms.  She is not yet ready to quit smoking.  Initially her blood pressure was elevated 150/90 however recheck came down to 124/84.  Her only medication is some thyroid  medicine.  PMHx:  Past Medical History:  Diagnosis Date   Cataract    DIVERTICULITIS, HX OF 11/13/2010   HYPOTHYROIDISM 11/13/2010   MIGRAINE HEADACHE 11/13/2010   SKIN CANCER, HX OF 11/13/2010   TOBACCO ABUSE 11/13/2010    Past Surgical History:  Procedure Laterality Date   EXCISION OF BACK LESION N/A 03/07/2024   Procedure: EXCISION, LESION, BACK;  Surgeon: Polly Cordella LABOR, MD;  Location: [facility];  Service: General;   Laterality: N/A;  LATERALITY: NA PRONE   NM MYOVIEW  LTD  05/2018    EF~55%. Normal BP response.  No EKG change.  LOW RISK. No Ischemia or Infarction.  (Mild Breast Attenuation)   THYROIDECTOMY  2004    FAMHx:  Family History  Problem Relation Age of Onset   Cancer Maternal Aunt        breast   Cancer Paternal Aunt        breast   Cancer Maternal Grandmother        breast   Cancer Paternal Grandmother        breast   Cancer Mother 32       Kidney   Atrial fibrillation Father    Heart attack Father 75    SOCHx:   reports that she has been smoking cigarettes. She has a 35 pack-year smoking history. She has never used smokeless tobacco. She reports that she does not drink alcohol and does not use drugs.  ALLERGIES:  Allergies  Allergen Reactions   Codeine Sulfate     REACTION: gi upset    ROS: Pertinent items noted in HPI and remainder of comprehensive ROS otherwise negative.  HOME MEDS: Current Outpatient Medications on File Prior to Visit  Medication Sig Dispense Refill   levothyroxine  (SYNTHROID ) 100 MCG tablet Take 100 mcg by mouth daily before breakfast.     SYNTHROID  100 MCG tablet TAKE 1 TABLET BY MOUTH EVERY DAY BEFORE BREAKFAST  90 tablet 3   No current facility-administered medications on file prior to visit.    LABS/IMAGING: No results found for this or any previous visit (from the past 48 hours). No results found.  LIPID PANEL:    Component Value Date/Time   CHOL 211 (H) 02/11/2024 1130   TRIG 50.0 02/11/2024 1130   HDL 64.10 02/11/2024 1130   CHOLHDL 3 02/11/2024 1130   VLDL 10.0 02/11/2024 1130   LDLCALC 137 (H) 02/11/2024 1130   LDLDIRECT 160.4 04/01/2011 0957    WEIGHTS: Wt Readings from Last 3 Encounters:  05/01/24 162 lb 6.4 oz (73.7 kg)  03/25/24 158 lb (71.7 kg)  03/07/24 158 lb 4.6 oz (71.8 kg)    VITALS: BP (!) 150/90 (BP Location: Left Arm, Patient Position: Sitting, Cuff Size: Normal)   Pulse 66   Ht 5' 5.5 (1.664 m)   Wt  162 lb 6.4 oz (73.7 kg)   SpO2 98%   BMI 26.61 kg/m   EXAM: Deferred  EKG: Deferred  ASSESSMENT: Dyslipidemia, goal LDL less than 70 Aortic and coronary artery atherosclerosis Emphysema 50-pack-year smoking history, current smoker Atorvastatin  intolerance  PLAN: 1.   Ms. Tumblin has previously had intolerance to atorvastatin .  This does not make her statin intolerant.  I would certainly like to trial her on an alternative statin and will start rosuvastatin 20 mg daily.  If this is also not well-tolerated would consider low-dose rosuvastatin or combination therapy with ezetimibe.  Her target LDL is less than 70 given aortic and coronary atherosclerosis and we discussed this today based on her imaging.  We also mentioned how important is to stop smoking because of the inflammatory component as well as the increased risk of lung cancer worsening emphysema which she already has emphysema.  She is not yet ready to quit.  She should reach out to me if she is intolerant of the statin otherwise we will continue that with a plan to repeat lipids in 3 to 4 months.  Thanks again for the kind referral.  Vinie KYM Maxcy, MD, [facility], FNLA, FACP    [facility]  Medical Director of the [facility]  [facility] Diplomate of the American Board of Clinical Lipidology Attending Cardiologist  Direct Dial: [phone]  Fax: [phone]  Website:  [email or web address]  Vinie JAYSON Maxcy 05/01/2024, 8:13 AM

## 2024-05-01 NOTE — Patient Instructions (Addendum)
 Medication Instructions:  START Rosuvastatin (Crestor) 20 mg daily *If you need a refill on your cardiac medications before your next appointment, please call your pharmacy*  Lab Work: FASTING lipids- NMR and LPa in 3 months If you have labs (blood work) drawn today and your tests are completely normal, you will receive your results only by: MyChart Message (if you have MyChart) OR A paper copy in the mail If you have any lab test that is abnormal or we need to change your treatment, we will call you to review the results.  Your next appointment:   Pending lab results and results of change in therapy

## 2024-05-29 DIAGNOSIS — H353210 Exudative age-related macular degeneration, right eye, stage unspecified: Secondary | ICD-10-CM | POA: Diagnosis not present

## 2024-06-06 DIAGNOSIS — C44729 Squamous cell carcinoma of skin of left lower limb, including hip: Secondary | ICD-10-CM | POA: Diagnosis not present

## 2024-06-06 DIAGNOSIS — C44722 Squamous cell carcinoma of skin of right lower limb, including hip: Secondary | ICD-10-CM | POA: Diagnosis not present

## 2024-06-06 DIAGNOSIS — D485 Neoplasm of uncertain behavior of skin: Secondary | ICD-10-CM | POA: Diagnosis not present

## 2024-06-06 DIAGNOSIS — Z85828 Personal history of other malignant neoplasm of skin: Secondary | ICD-10-CM | POA: Diagnosis not present

## 2024-06-15 DIAGNOSIS — D3131 Benign neoplasm of right choroid: Secondary | ICD-10-CM | POA: Diagnosis not present

## 2024-06-15 DIAGNOSIS — H353211 Exudative age-related macular degeneration, right eye, with active choroidal neovascularization: Secondary | ICD-10-CM | POA: Diagnosis not present

## 2024-06-15 DIAGNOSIS — H353122 Nonexudative age-related macular degeneration, left eye, intermediate dry stage: Secondary | ICD-10-CM | POA: Diagnosis not present

## 2024-06-15 DIAGNOSIS — H35371 Puckering of macula, right eye: Secondary | ICD-10-CM | POA: Diagnosis not present

## 2024-06-15 DIAGNOSIS — H43812 Vitreous degeneration, left eye: Secondary | ICD-10-CM | POA: Diagnosis not present

## 2024-06-30 DIAGNOSIS — H353211 Exudative age-related macular degeneration, right eye, with active choroidal neovascularization: Secondary | ICD-10-CM | POA: Diagnosis not present

## 2024-07-10 ENCOUNTER — Other Ambulatory Visit: Payer: Self-pay | Admitting: *Deleted

## 2024-07-10 DIAGNOSIS — C44722 Squamous cell carcinoma of skin of right lower limb, including hip: Secondary | ICD-10-CM | POA: Diagnosis not present

## 2024-07-10 DIAGNOSIS — L905 Scar conditions and fibrosis of skin: Secondary | ICD-10-CM | POA: Diagnosis not present

## 2024-07-10 DIAGNOSIS — E785 Hyperlipidemia, unspecified: Secondary | ICD-10-CM

## 2024-07-10 DIAGNOSIS — Z85828 Personal history of other malignant neoplasm of skin: Secondary | ICD-10-CM | POA: Diagnosis not present

## 2024-07-10 DIAGNOSIS — D485 Neoplasm of uncertain behavior of skin: Secondary | ICD-10-CM | POA: Diagnosis not present

## 2024-07-10 DIAGNOSIS — L820 Inflamed seborrheic keratosis: Secondary | ICD-10-CM | POA: Diagnosis not present

## 2024-08-01 DIAGNOSIS — H353211 Exudative age-related macular degeneration, right eye, with active choroidal neovascularization: Secondary | ICD-10-CM | POA: Diagnosis not present

## 2024-08-01 DIAGNOSIS — D3131 Benign neoplasm of right choroid: Secondary | ICD-10-CM | POA: Diagnosis not present

## 2024-08-01 DIAGNOSIS — H43812 Vitreous degeneration, left eye: Secondary | ICD-10-CM | POA: Diagnosis not present

## 2024-08-01 DIAGNOSIS — H353122 Nonexudative age-related macular degeneration, left eye, intermediate dry stage: Secondary | ICD-10-CM | POA: Diagnosis not present

## 2024-08-01 DIAGNOSIS — H35371 Puckering of macula, right eye: Secondary | ICD-10-CM | POA: Diagnosis not present

## 2024-08-01 DIAGNOSIS — M25551 Pain in right hip: Secondary | ICD-10-CM | POA: Diagnosis not present

## 2024-09-01 DIAGNOSIS — H353211 Exudative age-related macular degeneration, right eye, with active choroidal neovascularization: Secondary | ICD-10-CM | POA: Diagnosis not present

## 2024-09-12 DIAGNOSIS — D485 Neoplasm of uncertain behavior of skin: Secondary | ICD-10-CM | POA: Diagnosis not present

## 2024-09-12 DIAGNOSIS — L820 Inflamed seborrheic keratosis: Secondary | ICD-10-CM | POA: Diagnosis not present

## 2024-09-12 DIAGNOSIS — L570 Actinic keratosis: Secondary | ICD-10-CM | POA: Diagnosis not present

## 2024-09-12 DIAGNOSIS — C44722 Squamous cell carcinoma of skin of right lower limb, including hip: Secondary | ICD-10-CM | POA: Diagnosis not present

## 2024-09-12 DIAGNOSIS — Z85828 Personal history of other malignant neoplasm of skin: Secondary | ICD-10-CM | POA: Diagnosis not present

## 2024-09-12 DIAGNOSIS — C44629 Squamous cell carcinoma of skin of left upper limb, including shoulder: Secondary | ICD-10-CM | POA: Diagnosis not present

## 2024-09-26 ENCOUNTER — Telehealth: Payer: Self-pay

## 2024-09-26 NOTE — Telephone Encounter (Signed)
 Spoke with patient. Reviewed CMS guidelines for LCS. She is reaching out to [employer] to see if they will cover a LCS. CPT Code given.

## 2024-09-29 DIAGNOSIS — R051 Acute cough: Secondary | ICD-10-CM | POA: Diagnosis not present

## 2024-10-06 DIAGNOSIS — H353122 Nonexudative age-related macular degeneration, left eye, intermediate dry stage: Secondary | ICD-10-CM | POA: Diagnosis not present

## 2024-10-06 DIAGNOSIS — D3131 Benign neoplasm of right choroid: Secondary | ICD-10-CM | POA: Diagnosis not present

## 2024-10-06 DIAGNOSIS — H43812 Vitreous degeneration, left eye: Secondary | ICD-10-CM | POA: Diagnosis not present

## 2024-10-06 DIAGNOSIS — H35371 Puckering of macula, right eye: Secondary | ICD-10-CM | POA: Diagnosis not present

## 2024-10-06 DIAGNOSIS — H353211 Exudative age-related macular degeneration, right eye, with active choroidal neovascularization: Secondary | ICD-10-CM | POA: Diagnosis not present

## 2024-10-09 DIAGNOSIS — Z1231 Encounter for screening mammogram for malignant neoplasm of breast: Secondary | ICD-10-CM | POA: Diagnosis not present

## 2024-10-09 LAB — HM MAMMOGRAPHY

## 2024-10-25 ENCOUNTER — Encounter: Payer: Self-pay | Admitting: Family Medicine

## 2025-02-12 ENCOUNTER — Encounter: Admitting: Family Medicine
